# Patient Record
Sex: Female | Born: 1975
Health system: Southern US, Community
[De-identification: ages and names within clinical notes are randomized; demographics above are authoritative.]

## PROBLEM LIST (undated history)

## (undated) DIAGNOSIS — T7840XA Allergy, unspecified, initial encounter: Secondary | ICD-10-CM

## (undated) DIAGNOSIS — F419 Anxiety disorder, unspecified: Secondary | ICD-10-CM

## (undated) DIAGNOSIS — B019 Varicella without complication: Secondary | ICD-10-CM

## (undated) HISTORY — DX: Anxiety disorder, unspecified: F41.9

## (undated) HISTORY — DX: Allergy, unspecified, initial encounter: T78.40XA

## (undated) HISTORY — DX: Varicella without complication: B01.9

---

## 2016-03-05 HISTORY — PX: APPENDECTOMY: SHX54

## 2017-01-09 ENCOUNTER — Other Ambulatory Visit: Payer: Self-pay | Admitting: Physician Assistant

## 2017-01-09 DIAGNOSIS — Z139 Encounter for screening, unspecified: Secondary | ICD-10-CM

## 2017-02-07 ENCOUNTER — Ambulatory Visit: Payer: Self-pay

## 2018-04-24 DIAGNOSIS — E559 Vitamin D deficiency, unspecified: Secondary | ICD-10-CM | POA: Diagnosis not present

## 2018-04-24 DIAGNOSIS — R5383 Other fatigue: Secondary | ICD-10-CM | POA: Diagnosis not present

## 2018-07-03 DIAGNOSIS — Z01419 Encounter for gynecological examination (general) (routine) without abnormal findings: Secondary | ICD-10-CM | POA: Diagnosis not present

## 2018-07-03 DIAGNOSIS — Z6830 Body mass index (BMI) 30.0-30.9, adult: Secondary | ICD-10-CM | POA: Diagnosis not present

## 2019-07-16 DIAGNOSIS — Z9889 Other specified postprocedural states: Secondary | ICD-10-CM | POA: Insufficient documentation

## 2019-07-16 HISTORY — DX: Other specified postprocedural states: Z98.890

## 2019-07-20 ENCOUNTER — Other Ambulatory Visit: Payer: Self-pay | Admitting: Obstetrics and Gynecology

## 2019-07-20 DIAGNOSIS — R928 Other abnormal and inconclusive findings on diagnostic imaging of breast: Secondary | ICD-10-CM

## 2019-07-22 ENCOUNTER — Ambulatory Visit
Admission: RE | Admit: 2019-07-22 | Discharge: 2019-07-22 | Disposition: A | Discharge status: Home / Self Care | Payer: 59 | Source: Ambulatory Visit | Attending: Obstetrics and Gynecology | Admitting: Obstetrics and Gynecology

## 2019-07-22 ENCOUNTER — Other Ambulatory Visit: Payer: Self-pay

## 2019-07-22 DIAGNOSIS — R928 Other abnormal and inconclusive findings on diagnostic imaging of breast: Secondary | ICD-10-CM

## 2019-11-27 ENCOUNTER — Ambulatory Visit: Payer: 59 | Admitting: Family Medicine

## 2019-12-16 ENCOUNTER — Ambulatory Visit: Payer: 59 | Admitting: Family Medicine

## 2019-12-31 NOTE — Progress Notes (Signed)
Pt doesn't have appt sched

## 2021-06-04 IMAGING — MG DIGITAL DIAGNOSTIC UNILAT LEFT W/ CAD
4 series · 4 of 8 positions shown · non-contrast
Comparison: Previous exam(s).

CLINICAL DATA: The patient was called back for left breast
calcifications.

EXAM:
DIGITAL DIAGNOSTIC LEFT MAMMOGRAM

[L ML]
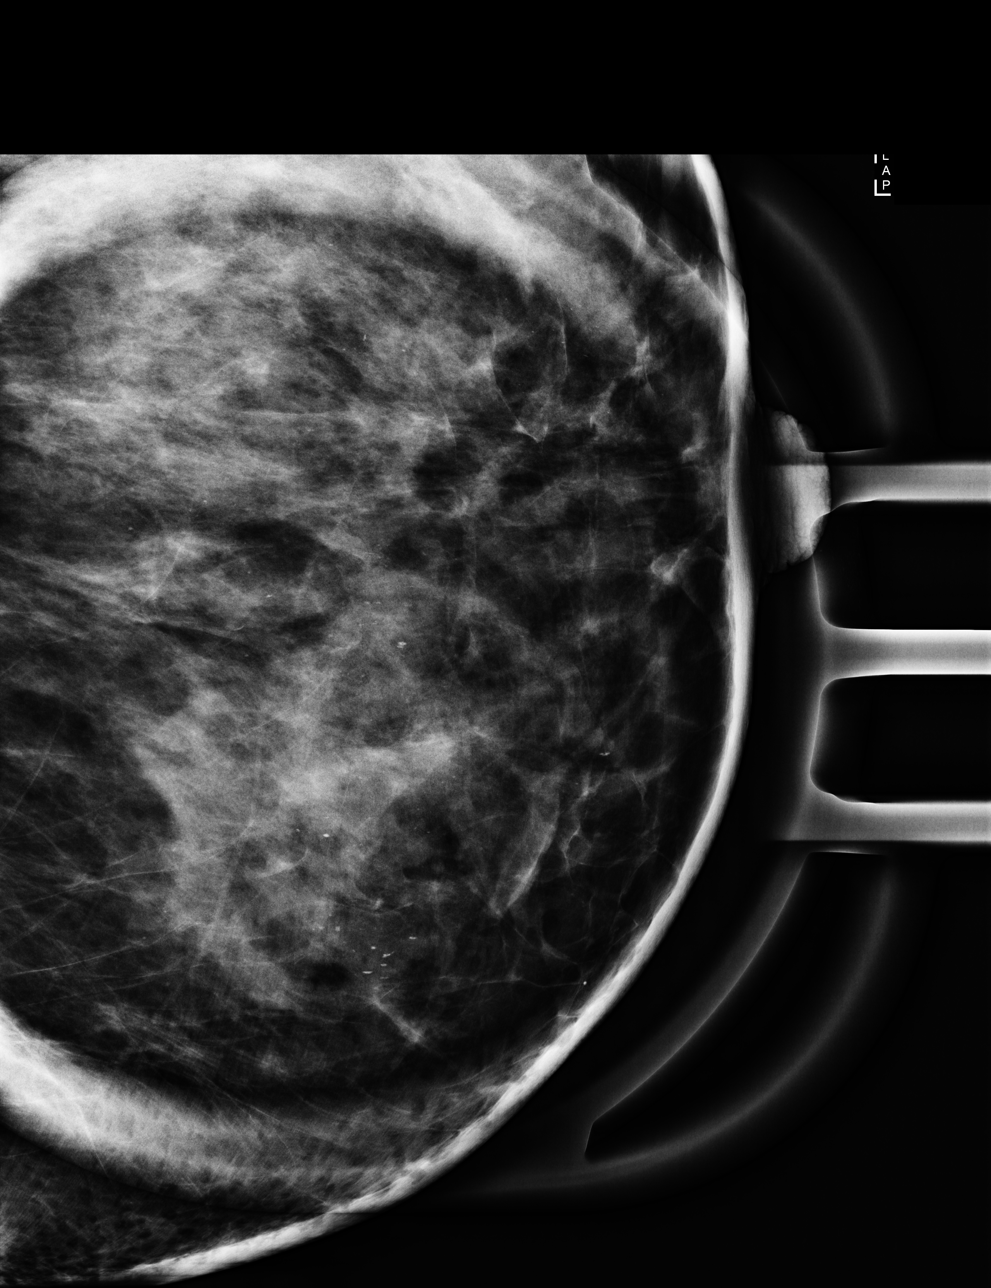

[L CC]
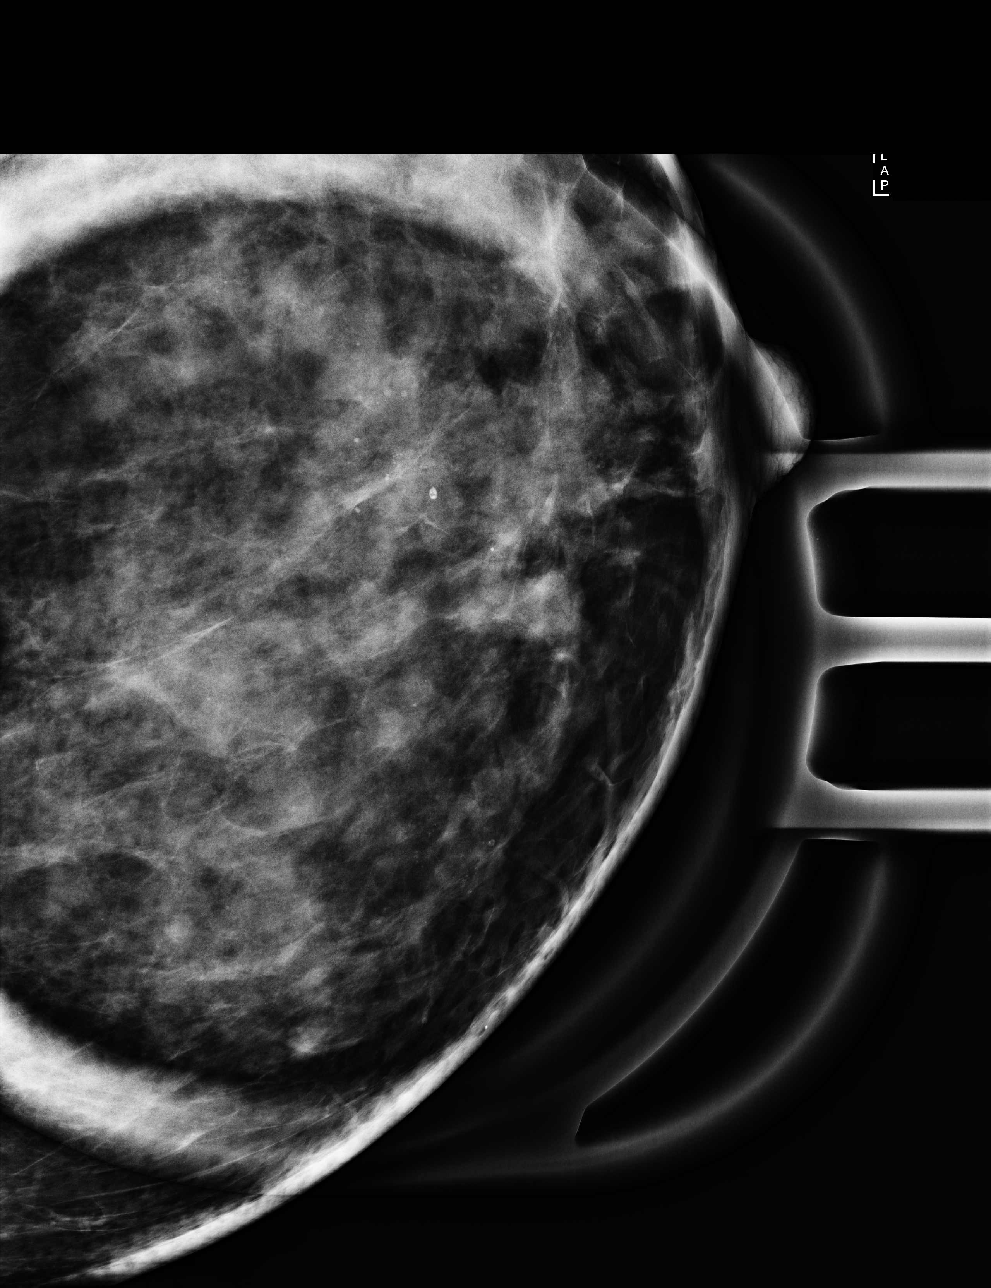

[L ML synth-2D]
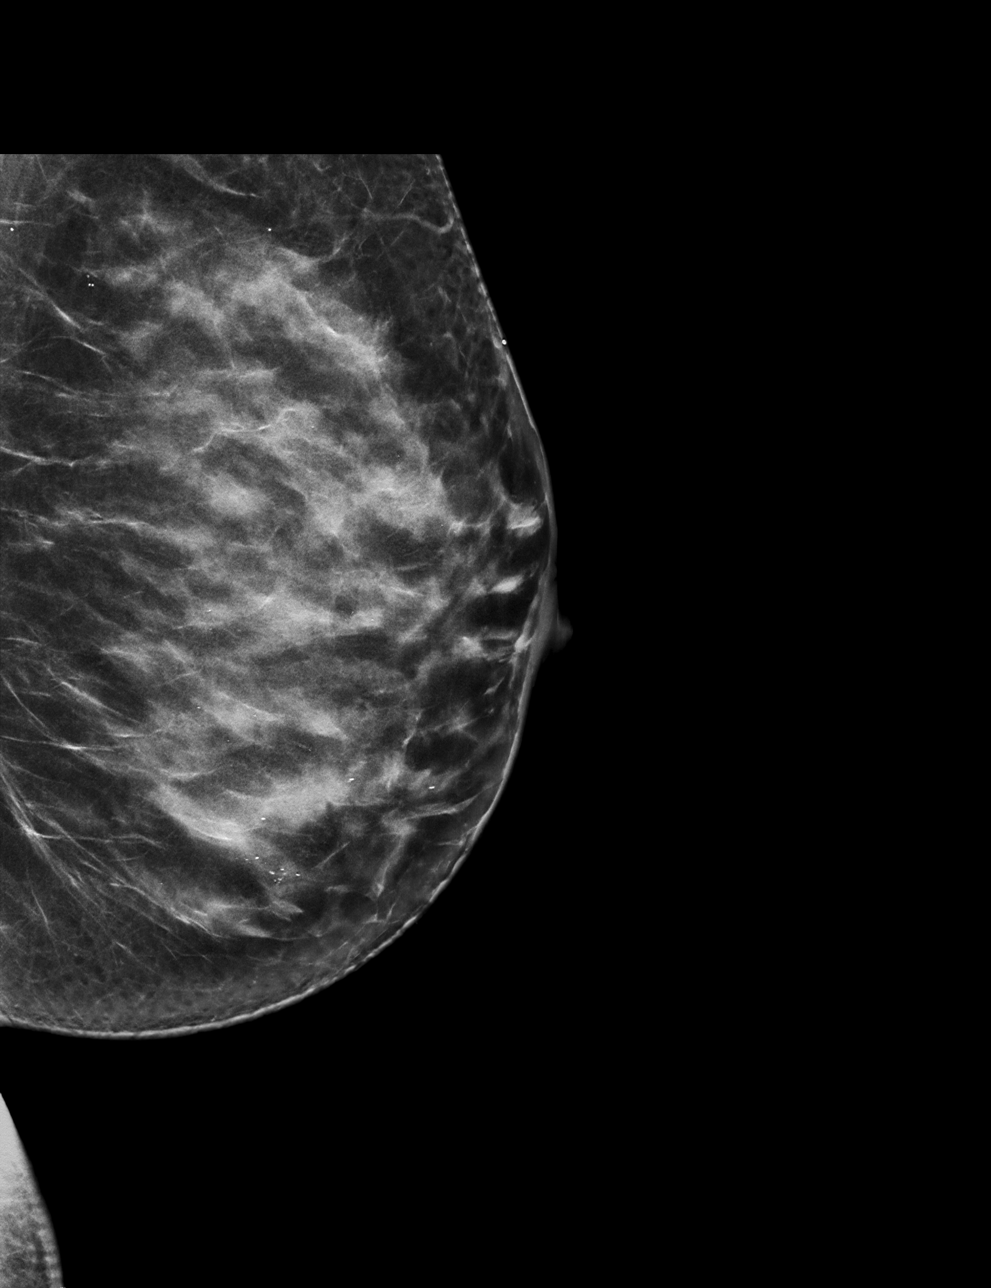

[L ML tomo · tomo slice 37/72.0]
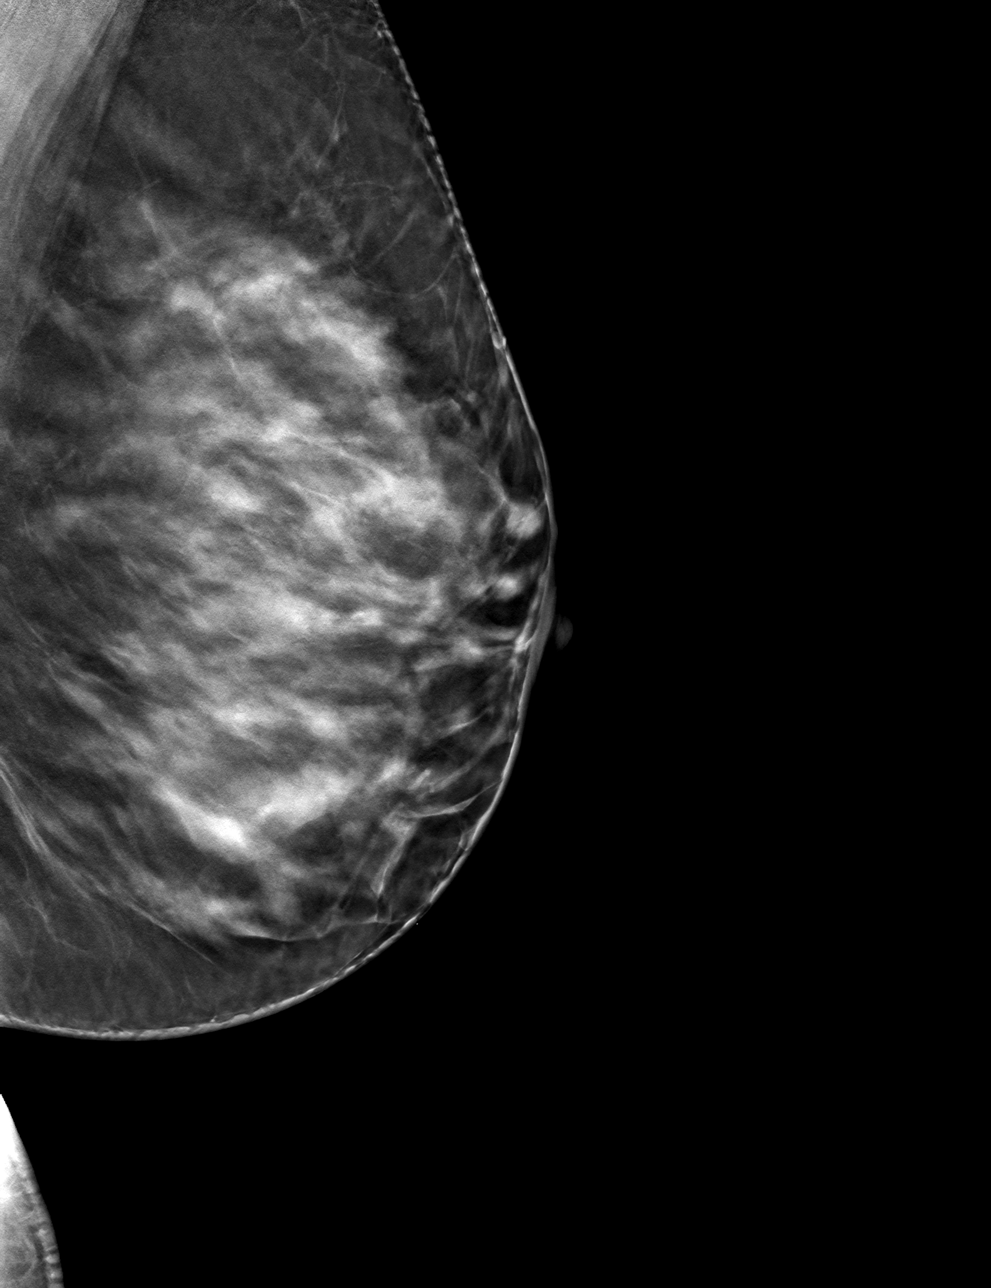

[4 of 8 positions shown; findings below may reference images not displayed]

ACR Breast Density Category c: The breast tissue is heterogeneously
dense, which may obscure small masses.
FINDINGS: The calcifications in the medial left breast layer on 90 degree
imaging. No evidence of malignancy.
IMPRESSION: Milk of calcium in the left breast. No suspicious findings. No
evidence of malignancy.

RECOMMENDATION:
Annual screening mammography.

I have discussed the findings and recommendations with the patient.
If applicable, a reminder letter will be sent to the patient
regarding the next appointment.

BI-RADS CATEGORY  2: Benign.

## 2021-09-21 LAB — HM PAP SMEAR: Pap: NEGATIVE

## 2021-09-21 LAB — HM MAMMOGRAPHY

## 2021-09-22 LAB — HM PAP SMEAR: Pap: NEGATIVE

## 2022-02-09 ENCOUNTER — Encounter: Payer: Self-pay | Admitting: Family Medicine

## 2022-02-12 ENCOUNTER — Encounter: Payer: Self-pay | Admitting: Family Medicine

## 2022-02-12 ENCOUNTER — Ambulatory Visit (INDEPENDENT_AMBULATORY_CARE_PROVIDER_SITE_OTHER): Payer: 59 | Admitting: Family Medicine

## 2022-02-12 VITALS — BP 124/84 | HR 84 | Temp 97.7°F | Ht 63.5 in | Wt 180.0 lb

## 2022-02-12 DIAGNOSIS — Z23 Encounter for immunization: Secondary | ICD-10-CM | POA: Diagnosis not present

## 2022-02-12 DIAGNOSIS — F909 Attention-deficit hyperactivity disorder, unspecified type: Secondary | ICD-10-CM | POA: Diagnosis not present

## 2022-02-12 DIAGNOSIS — Z7689 Persons encountering health services in other specified circumstances: Secondary | ICD-10-CM | POA: Diagnosis not present

## 2022-02-12 MED ORDER — LISDEXAMFETAMINE DIMESYLATE 30 MG PO CAPS: 30.0000 mg | ORAL_CAPSULE | Freq: Every day | ORAL | 0 refills | Status: DC

## 2022-02-12 NOTE — Progress Notes (Signed)
Patient ID: Jamie Lin, female  DOB: 1975-08-11, 45 y.o.   MRN: [id number] Patient Care Team    Relationship Specialty Notifications Start End  Natalia Leatherwood, DO PCP - General Family Medicine  02/12/22   [facility]  Optometry  02/12/22   Harold Hedge, MD Consulting Physician Obstetrics and Gynecology  02/12/22     Chief Complaint  Patient presents with   Establish Care   ADHD    Subjective:  Jamie Lin is a 46 y.o.  female present for new patient establishment. All past medical history, surgical history, allergies, family history, immunizations, medications and social history were updated in the electronic medical record today. All recent labs, ED visits and hospitalizations within the last year were reviewed.  Adhd: Pt reports she has been prescribed vyvanse 30 mg qd and is compliant. She reports this was started for her in September.  She was being evaluated for her anxiety and they wanted to rule out ADHD as a potential cause of increased anxiety.  She did see a psychiatrist who diagnosed her with ADHD with mild anxiety.  They started her on Vyvanse and tapered her to 30 mg daily.  She states she does take this Monday through Friday or at least on workdays, and she feels it is helpful.  She feels her anxiety has decreased currently. Patient reports she had 1 anxiety and panic during COVID.  Reports in the past she was prescribed Zoloft for about 6 to 8 months and she noticed it blunted her emotions.  She also gained weight on the Zoloft which she is still having trouble getting off.  She was prescribed Wellbutrin but she admits she never took the Wellbutrin.     02/12/2022    1:03 PM  Depression screen PHQ 2/9  Decreased Interest 0  Down, Depressed, Hopeless 0  PHQ - 2 Score 0       No data to display                 No data to display         Immunization History  Administered Date(s) Administered   Hepatitis A, Adult 08/06/2017    Influenza,inj,Quad PF,6+ Mos 01/09/2017, 01/22/2019, 02/12/2022   Influenza-Unspecified 01/09/2017, 01/22/2019, 12/16/2019   Moderna Sars-Covid-2 Vaccination 10/05/2019, 11/02/2019    No results found.  Past Medical History:  Diagnosis Date   Allergies    Anxiety    Chicken pox    History of endometrial ablation 07/16/2019   No Known Allergies Past Surgical History:  Procedure Laterality Date   APPENDECTOMY  2018   CESAREAN SECTION  2010   x2-2010,2012   Family History  Problem Relation Age of Onset   Hyperlipidemia Mother    Diabetes Father    Hypertension Father    Hyperlipidemia Father    Learning disabilities Son    Diabetes Maternal Grandmother    Diabetes Maternal Grandfather    Hypertension Maternal Grandfather    Hyperlipidemia Maternal Grandfather    Heart attack Maternal Grandfather    Early death Paternal Grandmother    Heart attack Paternal Grandmother    Hypertension Paternal Grandfather    Hyperlipidemia Paternal Grandfather    Social History   Social History Narrative   Marital status/children/pets: Married, G2 P2   Education/employment: Bachelor's degree.  Employed as an Field seismologist:      -smoke alarm in the home:Yes     - wears seatbelt: Yes     -  Feels safe in their relationships: Yes       Allergies as of 02/12/2022   No Known Allergies      Medication List        Accurate as of February 12, 2022  4:21 PM. If you have any questions, ask your nurse or doctor.          Cholecalciferol 1.25 MG (50000 UT) Tabs Take by mouth.   Vyvanse 30 MG capsule Generic drug: lisdexamfetamine Take 1 capsule by mouth daily. What changed: Another medication with the same name was added. Make sure you understand how and when to take each. Changed by: Felix Pacini, DO   lisdexamfetamine 30 MG capsule Commonly known as: Vyvanse Take 1 capsule (30 mg total) by mouth daily. What changed: You were already taking a  medication with the same name, and this prescription was added. Make sure you understand how and when to take each. Changed by: Felix Pacini, DO   lisdexamfetamine 30 MG capsule Commonly known as: VYVANSE Take 1 capsule (30 mg total) by mouth daily. What changed: You were already taking a medication with the same name, and this prescription was added. Make sure you understand how and when to take each. Changed by: Felix Pacini, DO   lisdexamfetamine 30 MG capsule Commonly known as: VYVANSE Take 1 capsule (30 mg total) by mouth daily. What changed: You were already taking a medication with the same name, and this prescription was added. Make sure you understand how and when to take each. Changed by: Felix Pacini, DO        All past medical history, surgical history, allergies, family history, immunizations andmedications were updated in the EMR today and reviewed under the history and medication portions of their EMR.    No results found for this or any previous visit (from the past 2160 hour(s)).   ROS 14 pt review of systems performed and negative (unless mentioned in an HPI)  Objective: BP 124/84   Pulse 84   Temp 97.7 F (36.5 C) (Oral)   Ht 5' 3.5" (1.613 m)   Wt 180 lb (81.6 kg)   LMP 02/08/2022   SpO2 100%   BMI 31.39 kg/m  Physical Exam Vitals and nursing note reviewed.  Constitutional:      General: She is not in acute distress.    Appearance: Normal appearance. She is not ill-appearing, toxic-appearing or diaphoretic.  HENT:     Head: Normocephalic and atraumatic.  Eyes:     General: No scleral icterus.       Right eye: No discharge.        Left eye: No discharge.     Extraocular Movements: Extraocular movements intact.     Conjunctiva/sclera: Conjunctivae normal.     Pupils: Pupils are equal, round, and reactive to light.  Cardiovascular:     Rate and Rhythm: Normal rate and regular rhythm.  Pulmonary:     Effort: Pulmonary effort is normal.     Breath  sounds: Normal breath sounds.  Skin:    General: Skin is warm and dry.     Findings: No rash.  Neurological:     Mental Status: She is alert and oriented to person, place, and time. Mental status is at baseline.     Motor: No weakness.     Gait: Gait normal.  Psychiatric:        Mood and Affect: Mood normal.        Behavior: Behavior normal.  Thought Content: Thought content normal.        Judgment: Judgment normal.      Assessment/plan: Jamie Lin is a 46 y.o. female present for est care/cmc Establishing care with new doctor, encounter for attention deficit hyperactivity disorder (ADHD), unspecified ADHD type Stable.  Continue vyvanse 30 mg qd Discussed different options today for treatment and could consider Wellbutrin or Effexor in the future. NCCS database reviewed today and appropriate   Influenza vaccine needed administered today  Return in about 11 weeks (around 04/30/2022) for cpe (20 min), Routine chronic condition follow-up.  Orders Placed This Encounter  Procedures   Flu Vaccine QUAD 6+ mos PF IM (Fluarix Quad PF)   Meds ordered this encounter  Medications   lisdexamfetamine (VYVANSE) 30 MG capsule    Sig: Take 1 capsule (30 mg total) by mouth daily.    Dispense:  30 capsule    Refill:  0    May fill ~55 days after prescribed date   lisdexamfetamine (VYVANSE) 30 MG capsule    Sig: Take 1 capsule (30 mg total) by mouth daily.    Dispense:  30 capsule    Refill:  0    May fill ~25 days after prescribed date   lisdexamfetamine (VYVANSE) 30 MG capsule    Sig: Take 1 capsule (30 mg total) by mouth daily.    Dispense:  30 capsule    Refill:  0   Referral Orders  No referral(s) requested today     Note is dictated utilizing voice recognition software. Although note has been proof read prior to signing, occasional typographical errors still can be missed. If any questions arise, please do not hesitate to call for verification.  Electronically signed  by: Felix Pacini, DO [facility]

## 2022-02-12 NOTE — Patient Instructions (Addendum)
Return in about 11 weeks (around 04/30/2022) for cpe (20 min), Routine chronic condition follow-up.        Great to see you today.  I have refilled the medication(s) we provide.   If labs were collected, we will inform you of lab results once received either by echart message or telephone call.   - echart message- for normal results that have been seen by the patient already.   - telephone call: abnormal results or if patient has not viewed results in their echart.

## 2022-04-04 ENCOUNTER — Telehealth: Payer: Self-pay | Admitting: Family Medicine

## 2022-04-04 NOTE — Telephone Encounter (Signed)
Pt needs refill on lisdexamfetamine (VYVANSE) 30 MG capsule  she would like it sent to the [facility] on [address] in [place]

## 2022-04-04 NOTE — Telephone Encounter (Signed)
Patient was seen 02/12/2022 for her ADD and 3 prescriptions for Vyvanse were sent to her pharmacy.  She had been instructed on how to receive her prescriptions. She will need to call the pharmacy every time she needs a refill on Vyvanse.  She cannot call in the prescription number for refill, because it has to change every month. She follows up every 3 months with provider to receive 3 new prescriptions.  As long as she is scheduled every 3 months with provider, she will not run out of prescriptions at the pharmacy.

## 2022-04-05 ENCOUNTER — Encounter: Payer: Self-pay | Admitting: Family Medicine

## 2022-04-05 ENCOUNTER — Ambulatory Visit (INDEPENDENT_AMBULATORY_CARE_PROVIDER_SITE_OTHER): Payer: BC Managed Care – PPO | Admitting: Family Medicine

## 2022-04-05 VITALS — BP 128/86 | HR 88 | Temp 97.8°F | Ht 63.5 in

## 2022-04-05 DIAGNOSIS — R051 Acute cough: Secondary | ICD-10-CM | POA: Diagnosis not present

## 2022-04-05 DIAGNOSIS — U071 COVID-19: Secondary | ICD-10-CM | POA: Diagnosis not present

## 2022-04-05 LAB — POCT INFLUENZA A/B
Influenza A, POC: NEGATIVE
Influenza B, POC: NEGATIVE

## 2022-04-05 LAB — POC COVID19 BINAXNOW: SARS Coronavirus 2 Ag: POSITIVE — AB

## 2022-04-05 MED ORDER — LISDEXAMFETAMINE DIMESYLATE 30 MG PO CAPS: 30.0000 mg | ORAL_CAPSULE | Freq: Every day | ORAL | 0 refills | Status: DC

## 2022-04-05 MED ORDER — FLUTICASONE PROPIONATE 50 MCG/ACT NA SUSP: 2.0000 | Freq: Every day | NASAL | 6 refills | Status: DC

## 2022-04-05 MED ORDER — PREDNISONE 20 MG PO TABS: 40.0000 mg | ORAL_TABLET | Freq: Every day | ORAL | 0 refills | Status: DC

## 2022-04-05 NOTE — Patient Instructions (Signed)
No follow-ups on file.        Great to see you today.  I have refilled the medication(s) we provide.   If labs were collected, we will inform you of lab results once received either by echart message or telephone call.   - echart message- for normal results that have been seen by the patient already.   - telephone call: abnormal results or if patient has not viewed results in their echart.  

## 2022-04-05 NOTE — Progress Notes (Signed)
Jamie Lin , 1975/08/07, 47 y.o., female MRN: [id number] Patient Care Team    Relationship Specialty Notifications Start End  Ma Hillock, DO PCP - General Family Medicine  02/12/22   [facility] center  Optometry  02/12/22   Everlene Farrier, MD Consulting Physician Obstetrics and Gynecology  02/12/22     Chief Complaint  Patient presents with   Ear Fullness    Pt c/o ear fullness, loss of taste and smell, cough, post nasal drip x 1 week     Subjective: Pt presents for an OV with complaints of ear fullness, loss of taste and smell, dry cough and postnasal drip of 1 week duration.  She has not come in contact with any known sick contacts.  She reports she felt like she had a head cold and a start of a sinus infection.  Her husband noted today he started feeling sick.     02/12/2022    1:03 PM  Depression screen PHQ 2/9  Decreased Interest 0  Down, Depressed, Hopeless 0  PHQ - 2 Score 0    No Known Allergies Social History   Social History Narrative   Marital status/children/pets: Married, G2 P2   Education/employment: Bachelor's degree.  Employed as an Academic librarian:      -smoke alarm in the home:Yes     - wears seatbelt: Yes     - Feels safe in their relationships: Yes      Past Medical History:  Diagnosis Date   Allergies    Anxiety    Chicken pox    History of endometrial ablation 07/16/2019   Past Surgical History:  Procedure Laterality Date   APPENDECTOMY  2018   CESAREAN SECTION  2010   x2-2010,2012   Family History  Problem Relation Age of Onset   Hyperlipidemia Mother    Diabetes Father    Hypertension Father    Hyperlipidemia Father    Learning disabilities Son    Diabetes Maternal Grandmother    Diabetes Maternal Grandfather    Hypertension Maternal Grandfather    Hyperlipidemia Maternal Grandfather    Heart attack Maternal Grandfather    Early death Paternal Grandmother    Heart attack Paternal  Grandmother    Hypertension Paternal Grandfather    Hyperlipidemia Paternal Grandfather    Allergies as of 04/05/2022   No Known Allergies      Medication List        Accurate as of April 05, 2022 11:41 AM. If you have any questions, ask your nurse or doctor.          Cholecalciferol 1.25 MG (50000 UT) Tabs Take by mouth.   fluticasone 50 MCG/ACT nasal spray Commonly known as: FLONASE Place 2 sprays into both nostrils daily. Started by: Howard Pouch, DO   lisdexamfetamine 30 MG capsule Commonly known as: VYVANSE Take 1 capsule (30 mg total) by mouth daily. What changed: Another medication with the same name was removed. Continue taking this medication, and follow the directions you see here. Changed by: Howard Pouch, DO   lisdexamfetamine 30 MG capsule Commonly known as: VYVANSE Take 1 capsule (30 mg total) by mouth daily. What changed: Another medication with the same name was removed. Continue taking this medication, and follow the directions you see here. Changed by: Howard Pouch, DO   predniSONE 20 MG tablet Commonly known as: DELTASONE Take 2 tablets (40 mg total) by mouth daily with breakfast. Started by: Howard Pouch,  DO        All past medical history, surgical history, allergies, family history, immunizations andmedications were updated in the EMR today and reviewed under the history and medication portions of their EMR.     Review of Systems  Constitutional:  Negative for chills, fever and weight loss.  HENT:  Positive for congestion, ear pain and sinus pain. Negative for sore throat.   Eyes:  Negative for pain, discharge and redness.  Respiratory:  Negative for cough, hemoptysis, sputum production, shortness of breath and wheezing.   Gastrointestinal:  Negative for abdominal pain, nausea and vomiting.  Musculoskeletal:  Negative for myalgias.  Skin:  Negative for rash.  Neurological:  Positive for headaches. Negative for dizziness.   Negative,  with the exception of above mentioned in HPI   Objective:  BP 128/86   Pulse 88   Temp 97.8 F (36.6 C) (Oral)   Ht 5' 3.5" (1.613 m)   SpO2 99%   BMI 31.39 kg/m  Body mass index is 31.39 kg/m. Physical Exam Vitals and nursing note reviewed.  Constitutional:      General: She is not in acute distress.    Appearance: Normal appearance. She is normal weight. She is not ill-appearing or toxic-appearing.  HENT:     Head: Normocephalic and atraumatic.     Right Ear: Tympanic membrane, ear canal and external ear normal. There is no impacted cerumen.     Left Ear: Tympanic membrane, ear canal and external ear normal. There is no impacted cerumen.     Ears:     Comments: Left TM effusion    Nose: Rhinorrhea present. No congestion.     Mouth/Throat:     Mouth: Mucous membranes are moist.     Pharynx: No oropharyngeal exudate or posterior oropharyngeal erythema.  Eyes:     General: No scleral icterus.       Right eye: No discharge.        Left eye: No discharge.     Extraocular Movements: Extraocular movements intact.     Conjunctiva/sclera: Conjunctivae normal.     Pupils: Pupils are equal, round, and reactive to light.  Cardiovascular:     Rate and Rhythm: Normal rate and regular rhythm.  Pulmonary:     Effort: Pulmonary effort is normal. No respiratory distress.     Breath sounds: Normal breath sounds. No wheezing, rhonchi or rales.  Musculoskeletal:     Cervical back: Neck supple.  Lymphadenopathy:     Cervical: No cervical adenopathy.  Skin:    Findings: No rash.  Neurological:     Mental Status: She is alert and oriented to person, place, and time. Mental status is at baseline.     Motor: No weakness.     Coordination: Coordination normal.     Gait: Gait normal.  Psychiatric:        Mood and Affect: Mood normal.        Behavior: Behavior normal.        Thought Content: Thought content normal.        Judgment: Judgment normal.      No results found. No results  found. Results for orders placed or performed in visit on 04/05/22 (from the past 24 hour(s))  POC COVID-19 BinaxNow     Status: Abnormal   Collection Time: 04/05/22 11:33 AM  Result Value Ref Range   SARS Coronavirus 2 Ag Positive (A) Negative  POCT Influenza A/B     Status: Normal   Collection  Time: 04/05/22 11:33 AM  Result Value Ref Range   Influenza A, POC Negative Negative   Influenza B, POC Negative Negative    Assessment/Plan: Leighann Amadon is a 47 y.o. female present for OV for  1. Acute cough - POC COVID-19 BinaxNow-positive - POCT Influenza A/B-negative  2. COVID-19 Rest, hydrate.  + flonase, mucinex (DM if cough), nettie pot or nasal saline.  Resume burst prescribed, take until completed.  If cough present it can last up to 6-8 weeks.  F/U 2 weeks if not improved.  Reviewed home care instructions for COVID. Advised self-isolation at home for at least 5 days. After 5 days, if improved and fever resolved, can be in public, but should wear a mask around others for an additional 5 days. If symptoms, esp, dyspnea develops/worsens, recommend in-person evaluation at either an urgent care or the emergency room.  ADD-patient's pharmacy is unable to get Vyvanse in stock currently.  She is asking for transfer of [facility] to [facility]. Reviewed expectations re: course of current medical issues. Discussed self-management of symptoms. Outlined signs and symptoms indicating need for more acute intervention. Patient verbalized understanding and all questions were answered. Patient received an After-Visit Summary.    Orders Placed This Encounter  Procedures   POC COVID-19 BinaxNow   POCT Influenza A/B   Meds ordered this encounter  Medications   lisdexamfetamine (VYVANSE) 30 MG capsule    Sig: Take 1 capsule (30 mg total) by mouth daily.    Dispense:  30 capsule    Refill:  0    May fill ~25 days after prescribed date   lisdexamfetamine (VYVANSE) 30 MG capsule    Sig:  Take 1 capsule (30 mg total) by mouth daily.    Dispense:  30 capsule    Refill:  0   fluticasone (FLONASE) 50 MCG/ACT nasal spray    Sig: Place 2 sprays into both nostrils daily.    Dispense:  16 g    Refill:  6   predniSONE (DELTASONE) 20 MG tablet    Sig: Take 2 tablets (40 mg total) by mouth daily with breakfast.    Dispense:  10 tablet    Refill:  0   Referral Orders  No referral(s) requested today     Note is dictated utilizing voice recognition software. Although note has been proof read prior to signing, occasional typographical errors still can be missed. If any questions arise, please do not hesitate to call for verification.   electronically signed by:  Howard Pouch, DO  [place]

## 2022-04-05 NOTE — Telephone Encounter (Signed)
Pt discussed during OV that she wanted to transfer meds. Please see today OV

## 2022-04-30 ENCOUNTER — Encounter: Payer: Self-pay | Admitting: Family Medicine

## 2022-04-30 ENCOUNTER — Ambulatory Visit (INDEPENDENT_AMBULATORY_CARE_PROVIDER_SITE_OTHER): Payer: BC Managed Care – PPO | Admitting: Family Medicine

## 2022-04-30 VITALS — BP 115/82 | HR 89 | Temp 98.4°F | Ht 63.58 in | Wt 181.2 lb

## 2022-04-30 DIAGNOSIS — Z131 Encounter for screening for diabetes mellitus: Secondary | ICD-10-CM

## 2022-04-30 DIAGNOSIS — Z8249 Family history of ischemic heart disease and other diseases of the circulatory system: Secondary | ICD-10-CM

## 2022-04-30 DIAGNOSIS — Z13220 Encounter for screening for lipoid disorders: Secondary | ICD-10-CM | POA: Diagnosis not present

## 2022-04-30 DIAGNOSIS — Z130 Encounter for screening for diseases of the blood and blood-forming organs and certain disorders involving the immune mechanism: Secondary | ICD-10-CM

## 2022-04-30 DIAGNOSIS — Z1159 Encounter for screening for other viral diseases: Secondary | ICD-10-CM

## 2022-04-30 DIAGNOSIS — Z23 Encounter for immunization: Secondary | ICD-10-CM

## 2022-04-30 DIAGNOSIS — Z0000 Encounter for general adult medical examination without abnormal findings: Secondary | ICD-10-CM

## 2022-04-30 DIAGNOSIS — Z1231 Encounter for screening mammogram for malignant neoplasm of breast: Secondary | ICD-10-CM | POA: Diagnosis not present

## 2022-04-30 LAB — LIPID PANEL
Cholesterol: 212 mg/dL — ABNORMAL HIGH (ref 0–200)
HDL: 53.1 mg/dL (ref >39.00)
LDL Cholesterol: 137 mg/dL — ABNORMAL HIGH (ref 0–99)
NonHDL: 158.53
Total CHOL/HDL Ratio: 4
Triglycerides: 106 mg/dL (ref 0.0–149.0)
VLDL: 21.2 mg/dL (ref 0.0–40.0)

## 2022-04-30 LAB — COMPREHENSIVE METABOLIC PANEL
ALT: 17 U/L (ref 0–35)
AST: 13 U/L (ref 0–37)
Albumin: 4 g/dL (ref 3.5–5.2)
Alkaline Phosphatase: 59 U/L (ref 39–117)
BUN: 15 mg/dL (ref 6–23)
CO2: 26 mEq/L (ref 19–32)
Calcium: 9.4 mg/dL (ref 8.4–10.5)
Chloride: 105 mEq/L (ref 96–112)
Creatinine, Ser: 1.03 mg/dL (ref 0.40–1.20)
GFR: 66.22 mL/min (ref >60.00)
Glucose, Bld: 117 mg/dL — ABNORMAL HIGH (ref 70–99)
Potassium: 3.6 mEq/L (ref 3.5–5.1)
Sodium: 141 mEq/L (ref 135–145)
Total Bilirubin: 0.7 mg/dL (ref 0.2–1.2)
Total Protein: 6.4 g/dL (ref 6.0–8.3)

## 2022-04-30 LAB — CBC
HCT: 38.9 % (ref 36.0–46.0)
Hemoglobin: 13.4 g/dL (ref 12.0–15.0)
MCHC: 34.5 g/dL (ref 30.0–36.0)
MCV: 93 fl (ref 78.0–100.0)
Platelets: 262 10*3/uL (ref 150.0–400.0)
RBC: 4.18 Mil/uL (ref 3.87–5.11)
RDW: 12.4 % (ref 11.5–15.5)
WBC: 4.3 10*3/uL (ref 4.0–10.5)

## 2022-04-30 LAB — HEMOGLOBIN A1C: Hgb A1c MFr Bld: 5.3 % (ref 4.6–6.5)

## 2022-04-30 MED ORDER — LISDEXAMFETAMINE DIMESYLATE 30 MG PO CAPS: 30.0000 mg | ORAL_CAPSULE | Freq: Every day | ORAL | 0 refills | Status: DC

## 2022-04-30 NOTE — Patient Instructions (Addendum)
Return in about 11 weeks (around 07/16/2022) for Routine chronic condition follow-up.        Great to see you today.  I have refilled the medication(s) we provide.   If labs were collected, we will inform you of lab results once received either by echart message or telephone call.   - echart message- for normal results that have been seen by the patient already.   - telephone call: abnormal results or if patient has not viewed results in their echart.   If you are interested in weight loss counseling please make appt to discuss and bring with you 2 weeks of a food diary/log on notebook paper.  Food log is mandatory on first appt to proceed with counseling.  Weight loss counseling encompasses diet, exercise and can include  medications when appropriate and affordable.  There are routine appts for check-ins and weights to track progress and keep you on track.  Routine check-ins (in person) are also mandatory to continue with prescription refills. Check-in timeline  can range from 4 weeks to 12 weeks, depending on physician's recommendations and which step you are in of your weight loss journey.  Your BMI today is Body mass index is 31.51 kg/m.  Please check with your insurance prior to appt and ask them if they cover weight loss medications for your BMI? And if so, which medications. They may tell you some of the diabetes meds that are used for weight loss also,  are on your formulary- but this does not mean they are covered for weight loss only.  Even if they tell with a prior auth it is covered- make sure they check to see if you personally meet criteria with your BMI.

## 2022-04-30 NOTE — Progress Notes (Signed)
Patient ID: Jamie Lin, female  DOB: 1975-03-18, 47 y.o.   MRN: [id number] Patient Care Team    Relationship Specialty Notifications Start End  Ma Hillock, DO PCP - General Family Medicine  02/12/22   [facility]  Optometry  02/12/22   Everlene Farrier, MD Consulting Physician Obstetrics and Gynecology  02/12/22     Chief Complaint  Patient presents with   Annual Exam    [facility] Pt is not fasting    Subjective: Jamie Lin is a 47 y.o.  Female  present for CPE  All past medical history, surgical history, allergies, family history, immunizations, medications and social history were updated in the electronic medical record today. All recent labs, ED visits and hospitalizations within the last year were reviewed.  Health maintenance:  Colonoscopy: No family history.  Routine screening at 47 Mammogram: Completed 11/03/2021.  Completed at oncology Cervical cancer screening: last pap: 11/2021, by gynecology Immunizations: tdap updated today, Influenza UTD 2023 (encouraged yearly) Infectious disease screening: HIV completed, Hep C agreeable DEXA: Routine screening Patient has a Dental home. Hospitalizations/ED visits: Reviewed       04/30/2022    8:33 AM 02/12/2022    1:03 PM  Depression screen PHQ 2/9  Decreased Interest 0 0  Down, Depressed, Hopeless 0 0  PHQ - 2 Score 0 0       No data to display          Immunization History  Administered Date(s) Administered   Hepatitis A, Adult 08/06/2017   Influenza,inj,Quad PF,6+ Mos 01/09/2017, 01/22/2019, 02/12/2022   Influenza-Unspecified 01/09/2017, 01/22/2019, 12/16/2019   Moderna Sars-Covid-2 Vaccination 10/05/2019, 11/02/2019   Tdap 04/30/2022     Past Medical History:  Diagnosis Date   Allergies    Anxiety    Chicken pox    History of endometrial ablation 07/16/2019   No Known Allergies Past Surgical History:  Procedure Laterality Date   APPENDECTOMY  2018   CESAREAN SECTION  2010   x2-2010,2012    Family History  Problem Relation Age of Onset   Hyperlipidemia Mother    Diabetes Father    Hypertension Father    Hyperlipidemia Father    Learning disabilities Son    Diabetes Maternal Grandmother    Diabetes Maternal Grandfather    Hypertension Maternal Grandfather    Hyperlipidemia Maternal Grandfather    Heart attack Maternal Grandfather    Early death Paternal Grandmother    Heart attack Paternal Grandmother    Hypertension Paternal Grandfather    Hyperlipidemia Paternal Grandfather    Social History   Social History Narrative   Marital status/children/pets: Married, G2 P2   Education/employment: Bachelor's degree.  Employed as an Academic librarian:      -smoke alarm in the home:Yes     - wears seatbelt: Yes     - Feels safe in their relationships: Yes       Allergies as of 04/30/2022   No Known Allergies      Medication List        Accurate as of April 30, 2022  9:09 AM. If you have any questions, ask your nurse or doctor.          STOP taking these medications    predniSONE 20 MG tablet Commonly known as: DELTASONE Stopped by: Howard Pouch, DO       TAKE these medications    Cholecalciferol 1.25 MG (50000 UT) Tabs Take by mouth.   fluticasone 50 MCG/ACT  nasal spray Commonly known as: FLONASE Place 2 sprays into both nostrils daily.   lisdexamfetamine 30 MG capsule Commonly known as: VYVANSE Take 1 capsule (30 mg total) by mouth daily.   lisdexamfetamine 30 MG capsule Commonly known as: VYVANSE Take 1 capsule (30 mg total) by mouth daily.   MAG GLYCINATE PO Take by mouth.   PROBIOTIC BLEND PO Take by mouth.        All past medical history, surgical history, allergies, family history, immunizations andmedications were updated in the EMR today and reviewed under the history and medication portions of their EMR.     Recent Results (from the past 2160 hour(s))  POC COVID-19 BinaxNow     Status: Abnormal    Collection Time: 04/05/22 11:33 AM  Result Value Ref Range   SARS Coronavirus 2 Ag Positive (A) Negative  POCT Influenza A/B     Status: Normal   Collection Time: 04/05/22 11:33 AM  Result Value Ref Range   Influenza A, POC Negative Negative   Influenza B, POC Negative Negative     ROS 14 pt review of systems performed and negative (unless mentioned in an HPI)  Objective: BP 115/82   Pulse 89   Temp 98.4 F (36.9 C)   Ht 5' 3.58" (1.615 m)   Wt 181 lb 3.2 oz (82.2 kg)   LMP 04/20/2022   SpO2 99%   BMI 31.51 kg/m  Physical Exam Vitals and nursing note reviewed.  Constitutional:      General: She is not in acute distress.    Appearance: Normal appearance. She is not ill-appearing or toxic-appearing.  HENT:     Head: Normocephalic and atraumatic.     Right Ear: Tympanic membrane, ear canal and external ear normal. There is no impacted cerumen.     Left Ear: Tympanic membrane, ear canal and external ear normal. There is no impacted cerumen.     Nose: No congestion or rhinorrhea.     Mouth/Throat:     Mouth: Mucous membranes are moist.     Pharynx: Oropharynx is clear. No oropharyngeal exudate or posterior oropharyngeal erythema.  Eyes:     General: No scleral icterus.       Right eye: No discharge.        Left eye: No discharge.     Extraocular Movements: Extraocular movements intact.     Conjunctiva/sclera: Conjunctivae normal.     Pupils: Pupils are equal, round, and reactive to light.  Cardiovascular:     Rate and Rhythm: Normal rate and regular rhythm.     Pulses: Normal pulses.     Heart sounds: Normal heart sounds. No murmur heard.    No friction rub. No gallop.  Pulmonary:     Effort: Pulmonary effort is normal. No respiratory distress.     Breath sounds: Normal breath sounds. No stridor. No wheezing, rhonchi or rales.  Chest:     Chest wall: No tenderness.  Abdominal:     General: Abdomen is flat. Bowel sounds are normal. There is no distension.      Palpations: Abdomen is soft. There is no mass.     Tenderness: There is no abdominal tenderness. There is no right CVA tenderness, left CVA tenderness, guarding or rebound.     Hernia: No hernia is present.  Musculoskeletal:        General: No swelling, tenderness or deformity. Normal range of motion.     Cervical back: Normal range of motion and neck supple. No rigidity  or tenderness.     Right lower leg: No edema.     Left lower leg: No edema.  Lymphadenopathy:     Cervical: No cervical adenopathy.  Skin:    General: Skin is warm and dry.     Coloration: Skin is not jaundiced or pale.     Findings: No bruising, erythema, lesion or rash.  Neurological:     General: No focal deficit present.     Mental Status: She is alert and oriented to person, place, and time. Mental status is at baseline.     Cranial Nerves: No cranial nerve deficit.     Sensory: No sensory deficit.     Motor: No weakness.     Coordination: Coordination normal.     Gait: Gait normal.     Deep Tendon Reflexes: Reflexes normal.  Psychiatric:        Mood and Affect: Mood normal.        Behavior: Behavior normal.        Thought Content: Thought content normal.        Judgment: Judgment normal.      No results found.  Assessment/plan: Domingue Delorge is a 47 y.o. female present for CPE  Encounter for hepatitis C screening test for low risk patient - Hepatitis C Antibody Need for Tdap vaccination Updated today Lipid screening/ FH: heart disease - Lipid panel Diabetes mellitus screening - Comp Met (CMET) - Hemoglobin A1c Screening for deficiency anemia - CBC Routine general medical examination at a health care facility - CBC - Comp Met (CMET) - Hepatitis C Antibody - Lipid panel - Hemoglobin A1c Colonoscopy: No family history.  Routine screening at 47 Mammogram: Completed 11/03/2021.  Completed at oncology Cervical cancer screening: last pap: 11/2021, by gynecology Immunizations: tdap updated today,  Influenza UTD 2023 (encouraged yearly) Infectious disease screening: HIV completed, Hep C agreeable DEXA: Routine screening Patient was encouraged to exercise greater than 150 minutes a week. Patient was encouraged to choose a diet filled with fresh fruits and vegetables, and lean meats. AVS provided to patient today for education/recommendation on gender specific health and safety maintenance.  If you are interested in weight loss counseling please make appt to discuss and bring with you 2 weeks of a food diary/log on notebook paper.  Food log is mandatory on first appt to proceed with counseling.  Weight loss counseling encompasses diet, exercise and can include  medications when appropriate and affordable.  There are routine appts for check-ins and weights to track progress and keep you on track.  Routine check-ins (in person) are also mandatory to continue with prescription refills. Check-in timeline  can range from 4 weeks to 12 weeks, depending on physician's recommendations and which step you are in of your weight loss journey.  Your BMI today is Body mass index is 31.51 kg/m.  Please check with your insurance prior to appt and ask them if they cover weight loss medications for your BMI? And if so, which medications. They may tell you some of the diabetes meds that are used for weight loss also,  are on your formulary- but this does not mean they are covered for weight loss only.  Even if they tell with a prior auth it is covered- make sure they check to see if you personally meet criteria with your BMI.   Return in about 11 weeks (around 07/16/2022) for Routine chronic condition follow-up.   Orders Placed This Encounter  Procedures   Tdap vaccine greater than  or equal to 7yo IM   CBC   Comp Met (CMET)   Hepatitis C Antibody   Lipid panel   Hemoglobin A1c   Meds ordered this encounter  Medications   lisdexamfetamine (VYVANSE) 30 MG capsule    Sig: Take 1 capsule (30 mg total)  by mouth daily.    Dispense:  30 capsule    Refill:  0   lisdexamfetamine (VYVANSE) 30 MG capsule    Sig: Take 1 capsule (30 mg total) by mouth daily.    Dispense:  30 capsule    Refill:  0    May fill ~25 days after prescribed date   Referral Orders  No referral(s) requested today     Electronically signed by: Howard Pouch, [place]

## 2022-05-01 LAB — HEPATITIS C ANTIBODY: Hepatitis C Ab: NONREACTIVE

## 2022-07-16 ENCOUNTER — Ambulatory Visit (INDEPENDENT_AMBULATORY_CARE_PROVIDER_SITE_OTHER): Payer: BC Managed Care – PPO | Admitting: Family Medicine

## 2022-07-16 ENCOUNTER — Encounter: Payer: Self-pay | Admitting: Family Medicine

## 2022-07-16 VITALS — BP 120/80 | HR 104 | Temp 98.6°F | Wt 184.6 lb

## 2022-07-16 DIAGNOSIS — F902 Attention-deficit hyperactivity disorder, combined type: Secondary | ICD-10-CM | POA: Diagnosis not present

## 2022-07-16 MED ORDER — METHYLPHENIDATE HCL ER (OSM) 36 MG PO TBCR: 36.0000 mg | EXTENDED_RELEASE_TABLET | Freq: Every day | ORAL | 0 refills | Status: DC

## 2022-07-16 NOTE — Patient Instructions (Signed)
No follow-ups on file.        Great to see you today.  I have refilled the medication(s) we provide.   If labs were collected, we will inform you of lab results once received either by echart message or telephone call.   - echart message- for normal results that have been seen by the patient already.   - telephone call: abnormal results or if patient has not viewed results in their echart.  

## 2022-07-16 NOTE — Progress Notes (Signed)
Patient ID: Jamie Lin, female  DOB: 11-12-1975, 47 y.o.   MRN: [id number] Patient Care Team    Relationship Specialty Notifications Start End  Natalia Leatherwood, DO PCP - General Family Medicine  02/12/22   [facility] center  Optometry  02/12/22   Harold Hedge, MD Consulting Physician Obstetrics and Gynecology  02/12/22     Chief Complaint  Patient presents with   ADHD    Pt is having complications getting Rx filled     Subjective:  Jamie Lin is a 47 y.o.  female present for follow-up on ADHD All past medical history, surgical history, allergies, family history, immunizations, medications and social history were updated in the electronic medical record today. All recent labs, ED visits and hospitalizations within the last year were reviewed.  Adhd: Pt reports compliance with vyvanse 30 mg qd.  She feels this is working well for her. However, she has been unable to find the medication in stock. She is inquiring if there is a similar medication she could start instead. Prior note: She reports this was started for her in September.  She was being evaluated for her anxiety and they wanted to rule out ADHD as a potential cause of increased anxiety.  She did see a psychiatrist who diagnosed her with ADHD with mild anxiety.  They started her on Vyvanse and tapered her to 30 mg daily.  She states she does take this Monday through Friday or at least on workdays, and she feels it is helpful.  Patient reports she had 1 anxiety and panic during COVID.  Reports in the past she was prescribed Zoloft for about 6 to 8 months and she noticed it blunted her emotions.  She also gained weight on the Zoloft which she is still having trouble getting off.  She was prescribed Wellbutrin but she admits she never took the Wellbutrin.     07/16/2022    8:49 AM 04/30/2022    8:33 AM 02/12/2022    1:03 PM  Depression screen PHQ 2/9  Decreased Interest 0 0 0  Down, Depressed, Hopeless 0 0 0  PHQ - 2  Score 0 0 0       No data to display                07/16/2022    8:48 AM  Fall Risk   Falls in the past year? 0  Number falls in past yr: 0  Injury with Fall? 0  Risk for fall due to : No Fall Risks  Follow up Falls evaluation completed   Immunization History  Administered Date(s) Administered   Hepatitis A, Adult 08/06/2017   Influenza,inj,Quad PF,6+ Mos 01/09/2017, 01/22/2019, 02/12/2022   Influenza-Unspecified 01/09/2017, 01/22/2019, 12/16/2019   Moderna Sars-Covid-2 Vaccination 10/05/2019, 11/02/2019   Tdap 04/30/2022    No results found.  Past Medical History:  Diagnosis Date   Allergies    Anxiety    Chicken pox    History of endometrial ablation 07/16/2019   No Known Allergies Past Surgical History:  Procedure Laterality Date   APPENDECTOMY  2018   CESAREAN SECTION  2010   x2-2010,2012   Family History  Problem Relation Age of Onset   Hyperlipidemia Mother    Diabetes Father    Hypertension Father    Hyperlipidemia Father    Learning disabilities Son    Diabetes Maternal Grandmother    Diabetes Maternal Grandfather    Hypertension Maternal Grandfather    Hyperlipidemia Maternal Grandfather  Heart attack Maternal Grandfather    Early death Paternal Grandmother    Heart attack Paternal Grandmother    Hypertension Paternal Grandfather    Hyperlipidemia Paternal Grandfather    Social History   Social History Narrative   Marital status/children/pets: Married, G2 P2   Education/employment: Bachelor's degree.  Employed as an Field seismologist:      -smoke alarm in the home:Yes     - wears seatbelt: Yes     - Feels safe in their relationships: Yes       Allergies as of 07/16/2022   No Known Allergies      Medication List        Accurate as of Jul 16, 2022  9:01 AM. If you have any questions, ask your nurse or doctor.          STOP taking these medications    fluticasone 50 MCG/ACT nasal spray Commonly known  as: FLONASE Stopped by: Felix Pacini, DO   lisdexamfetamine 30 MG capsule Commonly known as: VYVANSE Stopped by: Felix Pacini, DO       TAKE these medications    Cholecalciferol 1.25 MG (50000 UT) Tabs Take by mouth.   MAG GLYCINATE PO Take by mouth.   methylphenidate 36 MG CR tablet Commonly known as: CONCERTA Take 1 tablet (36 mg total) by mouth daily. Started by: Felix Pacini, DO   methylphenidate 36 MG CR tablet Commonly known as: CONCERTA Take 1 tablet (36 mg total) by mouth daily. Start taking on: August 10, 2022 Started by: Felix Pacini, DO   methylphenidate 36 MG CR tablet Commonly known as: CONCERTA Take 1 tablet (36 mg total) by mouth daily. Start taking on: September 11, 2022 Started by: Felix Pacini, DO   methylphenidate 36 MG CR tablet Commonly known as: CONCERTA Take 1 tablet (36 mg total) by mouth daily. Start taking on: October 08, 2022 Started by: Felix Pacini, DO   PROBIOTIC BLEND PO Take by mouth.        All past medical history, surgical history, allergies, family history, immunizations andmedications were updated in the EMR today and reviewed under the history and medication portions of their EMR.      ROS 14 pt review of systems performed and negative (unless mentioned in an HPI)  Objective: BP 120/80   Pulse (!) 104   Temp 98.6 F (37 C)   Wt 184 lb 9.6 oz (83.7 kg)   LMP 07/06/2022   SpO2 100%   BMI 32.10 kg/m  Physical Exam Vitals and nursing note reviewed.  Constitutional:      General: She is not in acute distress.    Appearance: Normal appearance. She is not ill-appearing, toxic-appearing or diaphoretic.  HENT:     Head: Normocephalic and atraumatic.  Eyes:     General: No scleral icterus.       Right eye: No discharge.        Left eye: No discharge.     Extraocular Movements: Extraocular movements intact.     Conjunctiva/sclera: Conjunctivae normal.     Pupils: Pupils are equal, round, and reactive to light.   Cardiovascular:     Rate and Rhythm: Regular rhythm. Tachycardia present.  Pulmonary:     Effort: Pulmonary effort is normal.     Breath sounds: Normal breath sounds.  Skin:    General: Skin is warm and dry.     Findings: No rash.  Neurological:     Mental Status: She is alert  and oriented to person, place, and time. Mental status is at baseline.     Motor: No weakness.     Gait: Gait normal.  Psychiatric:        Mood and Affect: Mood normal.        Behavior: Behavior normal.        Thought Content: Thought content normal.        Judgment: Judgment normal.     Assessment/plan: Khylee Guglielmo is a 47 y.o. female present for Chronic Conditions/illness Management attention deficit hyperactivity disorder (ADHD), unspecified ADHD type DC vyvanse d/y stock issues with med Start concerta 36 mg qd.  NCCS database reviewed today and appropriate Follow-up 4 months  Return in about 14 weeks (around 10/22/2022) for Routine chronic condition follow-up.  No orders of the defined types were placed in this encounter.  Meds ordered this encounter  Medications   methylphenidate 36 MG PO CR tablet    Sig: Take 1 tablet (36 mg total) by mouth daily.    Dispense:  30 tablet    Refill:  0   methylphenidate 36 MG PO CR tablet    Sig: Take 1 tablet (36 mg total) by mouth daily.    Dispense:  30 tablet    Refill:  0   methylphenidate 36 MG PO CR tablet    Sig: Take 1 tablet (36 mg total) by mouth daily.    Dispense:  30 tablet    Refill:  0   methylphenidate 36 MG PO CR tablet    Sig: Take 1 tablet (36 mg total) by mouth daily.    Dispense:  30 tablet    Refill:  0   Referral Orders  No referral(s) requested today     Note is dictated utilizing voice recognition software. Although note has been proof read prior to signing, occasional typographical errors still can be missed. If any questions arise, please do not hesitate to call for verification.  Electronically signed by: Felix Pacini, DO [facility] [place]

## 2022-08-02 ENCOUNTER — Telehealth: Payer: Self-pay

## 2022-08-02 NOTE — Telephone Encounter (Signed)
Patient called regarding medication switch. During her last visit on 5/13, her medication was changed from Vyvanse 30mg  to Methylphendiate 36mg . She does not like how it is making her feel and would like to switch back.   Patient was made aware provider is out of office but we will follow up once response from provider received.  Please further advise.

## 2022-08-08 NOTE — Telephone Encounter (Signed)
Pt advised and states that she will call the pharmacy to make sure its in stock when she returns from vacation.

## 2022-08-08 NOTE — Telephone Encounter (Signed)
Please return patient's call. She was switched from Vyvanse 30 mg to this methylphenidate 36 mg because the Vyvanse 30 mg was too difficult to find in stock at her pharmacies. Before we switch anything again, I would recommend she call her pharmacy and see if the Vyvanse 30 is in stock.  Once I switch it, I cannot change them again for her without seeing her in person.  Therefore I do want her to be certain that she is able to get the Vyvanse.

## 2022-08-15 ENCOUNTER — Telehealth: Payer: Self-pay | Admitting: Family Medicine

## 2022-08-15 MED ORDER — LISDEXAMFETAMINE DIMESYLATE 30 MG PO CAPS: 30.0000 mg | ORAL_CAPSULE | Freq: Every day | ORAL | 0 refills | Status: DC

## 2022-08-15 NOTE — Telephone Encounter (Signed)
Vyvanse x4 scripts called into requested pharmacy. Any further refills/requests will require follow up visit .

## 2022-08-15 NOTE — Telephone Encounter (Signed)
Returned call and given pt info.

## 2022-08-15 NOTE — Telephone Encounter (Signed)
Patient was instructed to give the office  a call when she located a pharmacy that has Vyvanse in stock. She located the [facility] located inside the [facility] in [place].  She states that she would have a new prescription for this and will no longer be taking methylphenidate.

## 2022-08-15 NOTE — Telephone Encounter (Signed)
Pharmacy has been updated.

## 2022-10-04 DIAGNOSIS — Z01419 Encounter for gynecological examination (general) (routine) without abnormal findings: Secondary | ICD-10-CM | POA: Diagnosis not present

## 2022-10-04 DIAGNOSIS — Z1231 Encounter for screening mammogram for malignant neoplasm of breast: Secondary | ICD-10-CM | POA: Diagnosis not present

## 2022-10-04 DIAGNOSIS — Z6831 Body mass index (BMI) 31.0-31.9, adult: Secondary | ICD-10-CM | POA: Diagnosis not present

## 2022-10-08 ENCOUNTER — Other Ambulatory Visit: Payer: Self-pay | Admitting: Obstetrics and Gynecology

## 2022-10-08 DIAGNOSIS — R928 Other abnormal and inconclusive findings on diagnostic imaging of breast: Secondary | ICD-10-CM

## 2022-10-16 ENCOUNTER — Ambulatory Visit
Admission: RE | Admit: 2022-10-16 | Discharge: 2022-10-16 | Disposition: A | Discharge status: Home / Self Care | Payer: BC Managed Care – PPO | Source: Ambulatory Visit | Attending: Obstetrics and Gynecology | Admitting: Obstetrics and Gynecology

## 2022-10-16 DIAGNOSIS — R921 Mammographic calcification found on diagnostic imaging of breast: Secondary | ICD-10-CM | POA: Diagnosis not present

## 2022-10-16 DIAGNOSIS — R928 Other abnormal and inconclusive findings on diagnostic imaging of breast: Secondary | ICD-10-CM

## 2022-10-22 ENCOUNTER — Other Ambulatory Visit (HOSPITAL_BASED_OUTPATIENT_CLINIC_OR_DEPARTMENT_OTHER): Payer: Self-pay

## 2022-10-22 ENCOUNTER — Encounter: Payer: Self-pay | Admitting: Family Medicine

## 2022-10-22 ENCOUNTER — Ambulatory Visit: Payer: BC Managed Care – PPO | Admitting: Family Medicine

## 2022-10-22 VITALS — BP 114/78 | HR 73 | Temp 98.3°F | Ht 63.58 in | Wt 184.8 lb

## 2022-10-22 DIAGNOSIS — F902 Attention-deficit hyperactivity disorder, combined type: Secondary | ICD-10-CM | POA: Diagnosis not present

## 2022-10-22 MED ORDER — LISDEXAMFETAMINE DIMESYLATE 30 MG PO CAPS: 30.0000 mg | ORAL_CAPSULE | Freq: Every day | ORAL | 0 refills | Status: DC

## 2022-10-22 MED ORDER — LISDEXAMFETAMINE DIMESYLATE 30 MG PO CAPS
30.0000 mg | ORAL_CAPSULE | Freq: Every day | ORAL | 0 refills | Status: DC
  Filled 2022-11-08: qty 30, 30d supply, fill #0

## 2022-10-22 NOTE — Progress Notes (Signed)
Patient ID: Jamie Lin, female  DOB: [birth year], 47 y.o.   MRN: [id number] Patient Care Team    Relationship Specialty Notifications Start End  Natalia Leatherwood, DO PCP - General Family Medicine  02/12/22   [facility]  Optometry  02/12/22   Harold Hedge, MD Consulting Physician Obstetrics and Gynecology  02/12/22     Chief Complaint  Patient presents with   Medical Management of Chronic Issues    No medication concerns, needs RF for Vyvanse 30mg      Subjective:  Jamie Lin is a 47 y.o.  female present for follow-up on ADHD All past medical history, surgical history, allergies, family history, immunizations, medications and social history were updated in the electronic medical record today. All recent labs, ED visits and hospitalizations within the last year were reviewed.  Adhd: Pt reports compliance with vyvanse 30 mg qd.  She feels this is working well for her. However, she has had difficulties with pharmacy stock of this medication  Prior note: She reports this was started for her in September.  She was being evaluated for her anxiety and they wanted to rule out ADHD as a potential cause of increased anxiety.  She did see a psychiatrist who diagnosed her with ADHD with mild anxiety.  They started her on Vyvanse and tapered her to 30 mg daily.  She states she does take this Monday through Friday or at least on workdays, and she feels it is helpful.  Patient reports she had 1 anxiety and panic during COVID.  Reports in the past she was prescribed Zoloft for about 6 to 8 months and she noticed it blunted her emotions.  She also gained weight on the Zoloft which she is still having trouble getting off.  She was prescribed Wellbutrin but she admits she never took the Wellbutrin.     10/22/2022    8:01 AM 07/16/2022    8:49 AM 04/30/2022    8:33 AM 02/12/2022    1:03 PM  Depression screen PHQ 2/9  Decreased Interest 0 0 0 0  Down, Depressed, Hopeless 0 0 0 0  PHQ - 2  Score 0 0 0 0  Altered sleeping 2     Tired, decreased energy 0     Change in appetite 0     Feeling bad or failure about yourself  0     Trouble concentrating 0     Moving slowly or fidgety/restless 0     Suicidal thoughts 0     PHQ-9 Score 2     Difficult doing work/chores Not difficult at all         10/22/2022    8:01 AM  GAD 7 : Generalized Anxiety Score  Nervous, Anxious, on Edge 0  Control/stop worrying 0  Worry too much - different things 0  Trouble relaxing 0  Restless 0  Easily annoyed or irritable 0  Afraid - awful might happen 0  Total GAD 7 Score 0  Anxiety Difficulty Not difficult at all          07/16/2022    8:48 AM  Fall Risk   Falls in the past year? 0  Number falls in past yr: 0  Injury with Fall? 0  Risk for fall due to : No Fall Risks  Follow up Falls evaluation completed   Immunization History  Administered Date(s) Administered   Hepatitis A, Adult 08/06/2017   Influenza,inj,Quad PF,6+ Mos 01/09/2017, 01/22/2019, 02/12/2022   Influenza-Unspecified 01/09/2017,  01/22/2019, 12/16/2019   Moderna Sars-Covid-2 Vaccination 10/05/2019, 11/02/2019   Tdap 04/30/2022    No results found.  Past Medical History:  Diagnosis Date   Allergies    Anxiety    Chicken pox    History of endometrial ablation 07/16/2019   No Known Allergies Past Surgical History:  Procedure Laterality Date   APPENDECTOMY  2018   CESAREAN SECTION  2010   x2-2010,2012   Family History  Problem Relation Age of Onset   Hyperlipidemia Mother    Diabetes Father    Hypertension Father    Hyperlipidemia Father    Learning disabilities Son    Diabetes Maternal Grandmother    Diabetes Maternal Grandfather    Hypertension Maternal Grandfather    Hyperlipidemia Maternal Grandfather    Heart attack Maternal Grandfather    Early death Paternal Grandmother    Heart attack Paternal Grandmother    Hypertension Paternal Grandfather    Hyperlipidemia Paternal Grandfather     Social History   Social History Narrative   Marital status/children/pets: Married, G2 P2   Education/employment: Bachelor's degree.  Employed as an Field seismologist:      -smoke alarm in the home:Yes     - wears seatbelt: Yes     - Feels safe in their relationships: Yes       Allergies as of 10/22/2022   No Known Allergies      Medication List        Accurate as of October 22, 2022  8:12 AM. If you have any questions, ask your nurse or doctor.          Cholecalciferol 1.25 MG (50000 UT) Tabs Take by mouth.   lisdexamfetamine 30 MG capsule Commonly known as: VYVANSE Take 1 capsule (30 mg total) by mouth daily. Start taking on: November 08, 2022 What changed: These instructions start on November 08, 2022. If you are unsure what to do until then, ask your doctor or other care provider. Changed by: Felix Pacini   lisdexamfetamine 30 MG capsule Commonly known as: VYVANSE Take 1 capsule (30 mg total) by mouth daily. Start taking on: November 18, 2022 What changed: These instructions start on November 18, 2022. If you are unsure what to do until then, ask your doctor or other care provider. Changed by: Felix Pacini   lisdexamfetamine 30 MG capsule Commonly known as: VYVANSE Take 1 capsule (30 mg total) by mouth daily. Start taking on: December 12, 2022 What changed: These instructions start on December 12, 2022. If you are unsure what to do until then, ask your doctor or other care provider. Changed by: Felix Pacini   lisdexamfetamine 30 MG capsule Commonly known as: VYVANSE Take 1 capsule (30 mg total) by mouth daily. Start taking on: January 07, 2023 What changed: These instructions start on January 07, 2023. If you are unsure what to do until then, ask your doctor or other care provider. Changed by: Felix Pacini   MAG GLYCINATE PO Take by mouth.   PROBIOTIC BLEND PO Take by mouth.        All past medical history, surgical history,  allergies, family history, immunizations andmedications were updated in the EMR today and reviewed under the history and medication portions of their EMR.      ROS 14 pt review of systems performed and negative (unless mentioned in an HPI)  Objective: BP 114/78   Pulse 73   Temp 98.3 F (36.8 C) (Oral)   Ht 5' 3.58" (  1.615 m)   Wt 184 lb 12.8 oz (83.8 kg)   SpO2 99%   BMI 32.14 kg/m  Physical Exam Vitals and nursing note reviewed.  Constitutional:      General: She is not in acute distress.    Appearance: Normal appearance. She is normal weight. She is not ill-appearing or toxic-appearing.  HENT:     Head: Normocephalic and atraumatic.  Eyes:     General: No scleral icterus.       Right eye: No discharge.        Left eye: No discharge.     Extraocular Movements: Extraocular movements intact.     Conjunctiva/sclera: Conjunctivae normal.     Pupils: Pupils are equal, round, and reactive to light.  Skin:    Findings: No rash.  Neurological:     Mental Status: She is alert and oriented to person, place, and time. Mental status is at baseline.     Motor: No weakness.     Coordination: Coordination normal.     Gait: Gait normal.  Psychiatric:        Mood and Affect: Mood normal.        Behavior: Behavior normal.        Thought Content: Thought content normal.        Judgment: Judgment normal.     Assessment/plan: Diksha Feo is a 47 y.o. female present for Chronic Conditions/illness Management attention deficit hyperactivity disorder (ADHD), unspecified ADHD type stable Continue Vyvanse 30 NCCS database reviewed today and appropriate; called in dwg Follow-up 4 months  Return in about 6 months (around 05/02/2023) for cpe (20 min), Routine chronic condition follow-up.  No orders of the defined types were placed in this encounter.  Meds ordered this encounter  Medications   lisdexamfetamine (VYVANSE) 30 MG capsule    Sig: Take 1 capsule (30 mg total) by mouth daily.     Dispense:  30 capsule    Refill:  0   lisdexamfetamine (VYVANSE) 30 MG capsule    Sig: Take 1 capsule (30 mg total) by mouth daily.    Dispense:  30 capsule    Refill:  0    May fill ~25 days after prescribed date   lisdexamfetamine (VYVANSE) 30 MG capsule    Sig: Take 1 capsule (30 mg total) by mouth daily.    Dispense:  30 capsule    Refill:  0   lisdexamfetamine (VYVANSE) 30 MG capsule    Sig: Take 1 capsule (30 mg total) by mouth daily.    Dispense:  30 capsule    Refill:  0   Referral Orders  No referral(s) requested today     Note is dictated utilizing voice recognition software. Although note has been proof read prior to signing, occasional typographical errors still can be missed. If any questions arise, please do not hesitate to call for verification.  Electronically signed by: Felix Pacini, DO [facility]

## 2022-10-22 NOTE — Patient Instructions (Addendum)
Return in about 6 months (around 05/02/2023) for cpe (20 min), Routine chronic condition follow-up.        Great to see you today.  I have refilled the medication(s) we provide.   If labs were collected or images ordered, we will inform you of  results once we have received them and reviewed. We will contact you either by echart message, or telephone call.  Please give ample time to the testing facility, and our office to run,  receive and review results. Please do not call inquiring of results, even if you can see them in your chart. We will contact you as soon as we are able. If it has been over 1 week since the test was completed, and you have not yet heard from [place], then please call us.    - echart message- for normal results that have been seen by the patient already.   - telephone call: abnormal results or if patient has not viewed results in their echart.  If a referral to a specialist was entered for you, please call us in 2 weeks if you have not heard from the specialist office to schedule.

## 2022-11-08 ENCOUNTER — Other Ambulatory Visit (HOSPITAL_BASED_OUTPATIENT_CLINIC_OR_DEPARTMENT_OTHER): Payer: Self-pay

## 2022-11-08 ENCOUNTER — Encounter: Payer: Self-pay | Admitting: Family Medicine

## 2023-01-27 DIAGNOSIS — R0981 Nasal congestion: Secondary | ICD-10-CM | POA: Diagnosis not present

## 2023-01-27 DIAGNOSIS — R051 Acute cough: Secondary | ICD-10-CM | POA: Diagnosis not present

## 2023-01-27 DIAGNOSIS — J029 Acute pharyngitis, unspecified: Secondary | ICD-10-CM | POA: Diagnosis not present

## 2023-05-03 ENCOUNTER — Encounter: Payer: Self-pay | Admitting: Family Medicine

## 2023-05-03 ENCOUNTER — Ambulatory Visit (INDEPENDENT_AMBULATORY_CARE_PROVIDER_SITE_OTHER): Payer: BC Managed Care – PPO | Admitting: Family Medicine

## 2023-05-03 VITALS — BP 118/84 | HR 78 | Temp 98.3°F | Ht 64.0 in | Wt 188.6 lb

## 2023-05-03 DIAGNOSIS — E669 Obesity, unspecified: Secondary | ICD-10-CM | POA: Insufficient documentation

## 2023-05-03 DIAGNOSIS — Z8249 Family history of ischemic heart disease and other diseases of the circulatory system: Secondary | ICD-10-CM

## 2023-05-03 DIAGNOSIS — Z131 Encounter for screening for diabetes mellitus: Secondary | ICD-10-CM

## 2023-05-03 DIAGNOSIS — Z0000 Encounter for general adult medical examination without abnormal findings: Secondary | ICD-10-CM | POA: Diagnosis not present

## 2023-05-03 DIAGNOSIS — F902 Attention-deficit hyperactivity disorder, combined type: Secondary | ICD-10-CM | POA: Diagnosis not present

## 2023-05-03 DIAGNOSIS — Z1211 Encounter for screening for malignant neoplasm of colon: Secondary | ICD-10-CM

## 2023-05-03 LAB — CBC
HCT: 41.5 % (ref 36.0–46.0)
Hemoglobin: 14 g/dL (ref 12.0–15.0)
MCHC: 33.6 g/dL (ref 30.0–36.0)
MCV: 95.3 fl (ref 78.0–100.0)
Platelets: 249 10*3/uL (ref 150.0–400.0)
RBC: 4.36 Mil/uL (ref 3.87–5.11)
RDW: 12.3 % (ref 11.5–15.5)
WBC: 4.2 10*3/uL (ref 4.0–10.5)

## 2023-05-03 LAB — COMPREHENSIVE METABOLIC PANEL
ALT: 19 U/L (ref 0–35)
AST: 14 U/L (ref 0–37)
Albumin: 4.5 g/dL (ref 3.5–5.2)
Alkaline Phosphatase: 46 U/L (ref 39–117)
BUN: 16 mg/dL (ref 6–23)
CO2: 27 meq/L (ref 19–32)
Calcium: 9.2 mg/dL (ref 8.4–10.5)
Chloride: 103 meq/L (ref 96–112)
Creatinine, Ser: 0.87 mg/dL (ref 0.40–1.20)
GFR: 80.52 mL/min (ref >60.00)
Glucose, Bld: 103 mg/dL — ABNORMAL HIGH (ref 70–99)
Potassium: 4.3 meq/L (ref 3.5–5.1)
Sodium: 137 meq/L (ref 135–145)
Total Bilirubin: 0.6 mg/dL (ref 0.2–1.2)
Total Protein: 7 g/dL (ref 6.0–8.3)

## 2023-05-03 LAB — LIPID PANEL
Cholesterol: 209 mg/dL — ABNORMAL HIGH (ref 0–200)
HDL: 53.8 mg/dL (ref >39.00)
LDL Cholesterol: 134 mg/dL — ABNORMAL HIGH (ref 0–99)
NonHDL: 155.48
Total CHOL/HDL Ratio: 4
Triglycerides: 106 mg/dL (ref 0.0–149.0)
VLDL: 21.2 mg/dL (ref 0.0–40.0)

## 2023-05-03 LAB — HEMOGLOBIN A1C: Hgb A1c MFr Bld: 5.2 % (ref 4.6–6.5)

## 2023-05-03 LAB — TSH: TSH: 3.31 u[IU]/mL (ref 0.35–5.50)

## 2023-05-03 MED ORDER — LISDEXAMFETAMINE DIMESYLATE 30 MG PO CAPS: 30.0000 mg | ORAL_CAPSULE | Freq: Every day | ORAL | 0 refills | Status: DC

## 2023-05-03 NOTE — Patient Instructions (Addendum)
 Return in about 1 year (around 05/03/2024) for cpe (20 min). Sooner if needed for medication refills or acute illnesses.        Great to see you today.  I have refilled the medication(s) we provide.   If labs were collected or images ordered, we will inform you of  results once we have received them and reviewed. We will contact you either by echart message, or telephone call.  Please give ample time to the testing facility, and our office to run,  receive and review results. Please do not call inquiring of results, even if you can see them in your chart. We will contact you as soon as we are able. If it has been over 1 week since the test was completed, and you have not yet heard from [place], then please call us.    - echart message- for normal results that have been seen by the patient already.   - telephone call: abnormal results or if patient has not viewed results in their echart.  If a referral to a specialist was entered for you, please call us in 2 weeks if you have not heard from the specialist office to schedule.

## 2023-05-03 NOTE — Progress Notes (Signed)
 Patient ID: Jamie Lin, female  DOB: Jun 26, 1975, 48 y.o.   MRN: [id number] Patient Care Team    Relationship Specialty Notifications Start End  Natalia Leatherwood, DO PCP - General Family Medicine  02/12/22   [facility] center  Optometry  02/12/22   Harold Hedge, MD Consulting Physician Obstetrics and Gynecology  02/12/22     Chief Complaint  Patient presents with   Annual Exam    Chronic Conditions/illness Management Pt is not fasting.     Subjective: Jamie Lin is a 48 y.o.  Female  present for CPE and chronic condition management All past medical history, surgical history, allergies, family history, immunizations, medications and social history were updated in the electronic medical record today. All recent labs, ED visits and hospitalizations within the last year were reviewed.  Health maintenance:  Colon cancer screening: No family history.  Routine screening at 47-cologuard ordered Mammogram: Completed [facility] completed at oncology or gyn Cervical cancer screening: last pap: 11/2021, by gynecology Immunizations: tdap UTD 2024, Influenza UTD- declined(encouraged yearly) Infectious disease screening: HIV completed, Hep C completed DEXA: Routine screening Patient has a Dental home. Hospitalizations/ED visits: Reviewed  Adhd: Pt reports compliance with vyvanse 30 mg qd.  She feels this is working well for her.Last pickup June 2024 . She reports she stopped it for awhile, but wants to restart. Prior note: She reports this was started for her in September.  She was being evaluated for her anxiety and they wanted to rule out ADHD as a potential cause of increased anxiety.  She did see a psychiatrist who diagnosed her with ADHD with mild anxiety.  They started her on Vyvanse and tapered her to 30 mg daily.  She states she does take this Monday through Friday or at least on workdays, and she feels it is helpful.  Patient reports she had 1 anxiety and panic during COVID.   Reports in the past she was prescribed Zoloft for about 6 to 8 months and she noticed it blunted her emotions.  She also gained weight on the Zoloft which she is still having trouble getting off.  She was prescribed Wellbutrin but she admits she never took the Wellbutrin     10/22/2022    8:01 AM 07/16/2022    8:49 AM 04/30/2022    8:33 AM 02/12/2022    1:03 PM  Depression screen PHQ 2/9  Decreased Interest 0 0 0 0  Down, Depressed, Hopeless 0 0 0 0  PHQ - 2 Score 0 0 0 0  Altered sleeping 2     Tired, decreased energy 0     Change in appetite 0     Feeling bad or failure about yourself  0     Trouble concentrating 0     Moving slowly or fidgety/restless 0     Suicidal thoughts 0     PHQ-9 Score 2     Difficult doing work/chores Not difficult at all         10/22/2022    8:01 AM  GAD 7 : Generalized Anxiety Score  Nervous, Anxious, on Edge 0  Control/stop worrying 0  Worry too much - different things 0  Trouble relaxing 0  Restless 0  Easily annoyed or irritable 0  Afraid - awful might happen 0  Total GAD 7 Score 0  Anxiety Difficulty Not difficult at all    Immunization History  Administered Date(s) Administered   Hepatitis A, Adult 08/06/2017   Influenza,inj,Quad PF,6+ Mos 01/09/2017,  01/22/2019, 02/12/2022   Influenza-Unspecified 01/09/2017, 01/22/2019, 12/16/2019   Moderna Sars-Covid-2 Vaccination 10/05/2019, 11/02/2019   Tdap 04/30/2022     Past Medical History:  Diagnosis Date   Allergies    Anxiety    Chicken pox    History of endometrial ablation 07/16/2019   No Known Allergies Past Surgical History:  Procedure Laterality Date   APPENDECTOMY  2018   CESAREAN SECTION  2010   x2-2010,2012   Family History  Problem Relation Age of Onset   Hyperlipidemia Mother    Diabetes Father    Hypertension Father    Hyperlipidemia Father    Learning disabilities Son    Diabetes Maternal Grandmother    Diabetes Maternal Grandfather    Hypertension Maternal  Grandfather    Hyperlipidemia Maternal Grandfather    Heart attack Maternal Grandfather    Early death Paternal Grandmother    Heart attack Paternal Grandmother    Hypertension Paternal Grandfather    Hyperlipidemia Paternal Grandfather    Social History   Social History Narrative   Marital status/children/pets: Married, G2 P2   Education/employment: Bachelor's degree.  Employed as an Field seismologist:      -smoke alarm in the home:Yes     - wears seatbelt: Yes     - Feels safe in their relationships: Yes       Allergies as of 05/03/2023   No Known Allergies      Medication List        Accurate as of May 03, 2023  9:44 AM. If you have any questions, ask your nurse or doctor.          azelastine 0.1 % nasal spray Commonly known as: ASTELIN two sprays by Both Nostrils route 2 (two) times daily. Use in each nostril as directed   brompheniramine-pseudoephedrine-DM 30-2-10 MG/5ML syrup Take 5 mLs by mouth 4 (four) times daily as needed.   Cholecalciferol 1.25 MG (50000 UT) Tabs Take by mouth.   lisdexamfetamine 30 MG capsule Commonly known as: VYVANSE Take 1 capsule (30 mg total) by mouth daily. What changed: Another medication with the same name was changed. Make sure you understand how and when to take each. Changed by: Jamie Lin   lisdexamfetamine 30 MG capsule Commonly known as: VYVANSE Take 1 capsule (30 mg total) by mouth daily. Start taking on: May 24, 2023 What changed: These instructions start on May 24, 2023. If you are unsure what to do until then, ask your doctor or other care provider. Changed by: Jamie Lin   lisdexamfetamine 30 MG capsule Commonly known as: VYVANSE Take 1 capsule (30 mg total) by mouth daily. Start taking on: June 21, 2023 What changed: These instructions start on June 21, 2023. If you are unsure what to do until then, ask your doctor or other care provider. Changed by: Jamie Lin    lisdexamfetamine 30 MG capsule Commonly known as: VYVANSE Take 1 capsule (30 mg total) by mouth daily. Start taking on: Jul 19, 2023 What changed: These instructions start on Jul 19, 2023. If you are unsure what to do until then, ask your doctor or other care provider. Changed by: Jamie Lin   MAG GLYCINATE PO Take by mouth.   PROBIOTIC BLEND PO Take by mouth.        All past medical history, surgical history, allergies, family history, immunizations andmedications were updated in the EMR today and reviewed under the history and medication portions of their EMR.  No results found for this or any previous visit (from the past 2160 hours).    ROS 14 pt review of systems performed and negative (unless mentioned in an HPI)  Objective: BP 118/84   Pulse 78   Temp 98.3 F (36.8 C)   Ht 5\' 4"  (1.626 m)   Wt 188 lb 9.6 oz (85.5 kg)   LMP 04/22/2023   SpO2 99%   BMI 32.37 kg/m  Physical Exam Vitals and nursing note reviewed.  Constitutional:      General: She is not in acute distress.    Appearance: Normal appearance. She is not ill-appearing or toxic-appearing.  HENT:     Head: Normocephalic and atraumatic.     Right Ear: Tympanic membrane, ear canal and external ear normal. There is no impacted cerumen.     Left Ear: Tympanic membrane, ear canal and external ear normal. There is no impacted cerumen.     Nose: No congestion or rhinorrhea.     Mouth/Throat:     Mouth: Mucous membranes are moist.     Pharynx: Oropharynx is clear. No oropharyngeal exudate or posterior oropharyngeal erythema.  Eyes:     General: No scleral icterus.       Right eye: No discharge.        Left eye: No discharge.     Extraocular Movements: Extraocular movements intact.     Conjunctiva/sclera: Conjunctivae normal.     Pupils: Pupils are equal, round, and reactive to light.  Cardiovascular:     Rate and Rhythm: Normal rate and regular rhythm.     Pulses: Normal pulses.     Heart  sounds: Normal heart sounds. No murmur heard.    No friction rub. No gallop.  Pulmonary:     Effort: Pulmonary effort is normal. No respiratory distress.     Breath sounds: Normal breath sounds. No stridor. No wheezing, rhonchi or rales.  Chest:     Chest wall: No tenderness.  Abdominal:     General: Abdomen is flat. Bowel sounds are normal. There is no distension.     Palpations: Abdomen is soft. There is no mass.     Tenderness: There is no abdominal tenderness. There is no right CVA tenderness, left CVA tenderness, guarding or rebound.     Hernia: No hernia is present.  Musculoskeletal:        General: No swelling, tenderness or deformity. Normal range of motion.     Cervical back: Normal range of motion and neck supple. No rigidity or tenderness.     Right lower leg: No edema.     Left lower leg: No edema.  Lymphadenopathy:     Cervical: No cervical adenopathy.  Skin:    General: Skin is warm and dry.     Coloration: Skin is not jaundiced or pale.     Findings: No bruising, erythema, lesion or rash.  Neurological:     General: No focal deficit present.     Mental Status: She is alert and oriented to person, place, and time. Mental status is at baseline.     Cranial Nerves: No cranial nerve deficit.     Sensory: No sensory deficit.     Motor: No weakness.     Coordination: Coordination normal.     Gait: Gait normal.     Deep Tendon Reflexes: Reflexes normal.  Psychiatric:        Mood and Affect: Mood normal.        Behavior: Behavior normal.  Thought Content: Thought content normal.        Judgment: Judgment normal.      No results found.  Assessment/plan: Jamie Lin is a 48 y.o. female present for CPE  Routine general medical examination at a health care facility Patient was encouraged to exercise greater than 150 minutes a week. Patient was encouraged to choose a diet filled with fresh fruits and vegetables, and lean meats. AVS provided to patient today for  education/recommendation on gender specific health and safety maintenance. Colon cancer screening: No family history.  Routine screening at 47-cologuard ordered Mammogram: Completed [facility]-[place] completed at oncology or gyn Cervical cancer screening: last pap: 11/2021, by gynecology Immunizations: tdap UTD 2024, Influenza UTD- declined(encouraged yearly) Infectious disease screening: HIV completed, Hep C completed DEXA: Routine screening - CBC - Comprehensive metabolic panel - TSH  Colon cancer screening - Cologuard Attention deficit hyperactivity disorder (ADHD), combined type Stable Vyvanse 30 mg every day x4 F/u 4 mos if needing refills  FH: heart disease/E66.9 (BMI 30-39.9)/Diabetes mellitus screening - Hemoglobin A1c - Lipid panel  Return in about 1 year (around 05/03/2024) for cpe (20 min).   Orders Placed This Encounter  Procedures   CBC   Comprehensive metabolic panel   Hemoglobin A1c   Lipid panel   TSH   Cologuard   Meds ordered this encounter  Medications   lisdexamfetamine (VYVANSE) 30 MG capsule    Sig: Take 1 capsule (30 mg total) by mouth daily.    Dispense:  30 capsule    Refill:  0   lisdexamfetamine (VYVANSE) 30 MG capsule    Sig: Take 1 capsule (30 mg total) by mouth daily.    Dispense:  30 capsule    Refill:  0   lisdexamfetamine (VYVANSE) 30 MG capsule    Sig: Take 1 capsule (30 mg total) by mouth daily.    Dispense:  30 capsule    Refill:  0   lisdexamfetamine (VYVANSE) 30 MG capsule    Sig: Take 1 capsule (30 mg total) by mouth daily.    Dispense:  30 capsule    Refill:  0   Referral Orders  No referral(s) requested today     Electronically signed by: Jamie Pacini, DO [facility] [place]

## 2023-05-06 ENCOUNTER — Encounter: Payer: Self-pay | Admitting: Family Medicine

## 2023-05-31 DIAGNOSIS — Z1211 Encounter for screening for malignant neoplasm of colon: Secondary | ICD-10-CM | POA: Diagnosis not present

## 2023-06-04 LAB — COLOGUARD: COLOGUARD: NEGATIVE

## 2023-11-14 ENCOUNTER — Ambulatory Visit (INDEPENDENT_AMBULATORY_CARE_PROVIDER_SITE_OTHER): Admitting: Family Medicine

## 2023-11-14 ENCOUNTER — Encounter: Payer: Self-pay | Admitting: Family Medicine

## 2023-11-14 VITALS — BP 116/76 | HR 75 | Temp 98.5°F | Wt 192.6 lb

## 2023-11-14 DIAGNOSIS — F902 Attention-deficit hyperactivity disorder, combined type: Secondary | ICD-10-CM | POA: Diagnosis not present

## 2023-11-14 DIAGNOSIS — Z23 Encounter for immunization: Secondary | ICD-10-CM | POA: Diagnosis not present

## 2023-11-14 DIAGNOSIS — F419 Anxiety disorder, unspecified: Secondary | ICD-10-CM | POA: Insufficient documentation

## 2023-11-14 DIAGNOSIS — F3281 Premenstrual dysphoric disorder: Secondary | ICD-10-CM | POA: Insufficient documentation

## 2023-11-14 MED ORDER — LISDEXAMFETAMINE DIMESYLATE 30 MG PO CAPS: 30.0000 mg | ORAL_CAPSULE | Freq: Every day | ORAL | 0 refills | Status: DC

## 2023-11-14 MED ORDER — FLUOXETINE HCL 20 MG PO CAPS: ORAL_CAPSULE | ORAL | 3 refills | Status: DC

## 2023-11-14 NOTE — Progress Notes (Signed)
 Patient ID: Jamie Lin, female  DOB: 06/16/1975, 48 y.o.   MRN: [id number] Patient Care Team    Relationship Specialty Notifications Start End  Catherine Charlies LABOR, DO PCP - General Family Medicine  02/12/22   [facility]  Optometry  02/12/22   Curlene Agent, MD Consulting Physician Obstetrics and Gynecology  02/12/22     Chief Complaint  Patient presents with   ADHD    Pt has not been taking Vyvanse . Requesting refills.     Subjective: Jamie Lin is a 48 y.o.  Female  present for  chronic condition management All past medical history, surgical history, allergies, family history, immunizations, medications and social history were updated in the electronic medical record today. All recent labs, ED visits and hospitalizations within the last year were reviewed.   Adhd/anxiety/PMDD: Pt reports when she takes the Vyvanse  it works very well for her.  She had taken a break off Vyvanse .  She feels this is working well for her .  Patient feels it is helping her focus She reports she has noticed she has become a lot more irritable surrounding the start of her menstrual cycle.  She discussed this with her gynecology team, and they recommended she discuss with her PCP.  Patient would like to refrain from taking the medication daily to help with anxiety over PMDD.  She had a bad experience with Zoloft causing her to be emotionally blunted and gain weight. Prior note: She reports this was started for her in September.  She was being evaluated for her anxiety and they wanted to rule out ADHD as a potential cause of increased anxiety.  She did see a psychiatrist who diagnosed her with ADHD with mild anxiety.  They started her on Vyvanse  and tapered her to 30 mg daily.  She states she does take this Monday through Friday or at least on workdays, and she feels it is helpful.  Patient reports she had 1 anxiety and panic during COVID.  Reports in the past she was prescribed Zoloft for about 6 to  8 months and she noticed it blunted her emotions.  She also gained weight on the Zoloft which she is still having trouble getting off.  She was prescribed Wellbutrin but she admits she never took the Wellbutrin     10/22/2022    8:01 AM 07/16/2022    8:49 AM 04/30/2022    8:33 AM 02/12/2022    1:03 PM  Depression screen PHQ 2/9  Decreased Interest 0 0 0 0  Down, Depressed, Hopeless 0 0 0 0  PHQ - 2 Score 0 0 0 0  Altered sleeping 2     Tired, decreased energy 0     Change in appetite 0     Feeling bad or failure about yourself  0     Trouble concentrating 0     Moving slowly or fidgety/restless 0     Suicidal thoughts 0     PHQ-9 Score 2     Difficult doing work/chores Not difficult at all         10/22/2022    8:01 AM  GAD 7 : Generalized Anxiety Score  Nervous, Anxious, on Edge 0  Control/stop worrying 0  Worry too much - different things 0  Trouble relaxing 0  Restless 0  Easily annoyed or irritable 0  Afraid - awful might happen 0  Total GAD 7 Score 0  Anxiety Difficulty Not difficult at all    Immunization  History  Administered Date(s) Administered   Hepatitis A, Adult 08/06/2017   Influenza,inj,Quad PF,6+ Mos 01/09/2017, 01/22/2019, 02/12/2022   Influenza-Unspecified 01/09/2017, 01/22/2019, 12/16/2019   Moderna Sars-Covid-2 Vaccination 10/05/2019, 11/02/2019   Tdap 04/30/2022     Past Medical History:  Diagnosis Date   Allergies    Anxiety    Chicken pox    History of endometrial ablation 07/16/2019   No Known Allergies Past Surgical History:  Procedure Laterality Date   APPENDECTOMY  2018   CESAREAN SECTION  2010   x2-2010,2012   Family History  Problem Relation Age of Onset   Hyperlipidemia Mother    Diabetes Father    Hypertension Father    Hyperlipidemia Father    Learning disabilities Son    Diabetes Maternal Grandmother    Diabetes Maternal Grandfather    Hypertension Maternal Grandfather    Hyperlipidemia Maternal Grandfather    Heart  attack Maternal Grandfather    Early death Paternal Grandmother    Heart attack Paternal Grandmother    Hypertension Paternal Grandfather    Hyperlipidemia Paternal Grandfather    Social History   Social History Narrative   Marital status/children/pets: Married, G2 P2   Education/employment: Bachelor's degree.  Employed as an Field seismologist:      -smoke alarm in the home:Yes     - wears seatbelt: Yes     - Feels safe in their relationships: Yes       Allergies as of 11/14/2023   No Known Allergies      Medication List        Accurate as of November 14, 2023 10:40 AM. If you have any questions, ask your nurse or doctor.          azelastine 0.1 % nasal spray Commonly known as: ASTELIN two sprays by Both Nostrils route 2 (two) times daily. Use in each nostril as directed   brompheniramine-pseudoephedrine-DM 30-2-10 MG/5ML syrup Take 5 mLs by mouth 4 (four) times daily as needed.   Cholecalciferol 1.25 MG (50000 UT) Tabs Take by mouth.   FLUoxetine  20 MG capsule Commonly known as: PROZAC  1 tab PO week prior to menstrual cycle Started by: Charlies Bellini   lisdexamfetamine 30 MG capsule Commonly known as: VYVANSE  Take 1 capsule (30 mg total) by mouth daily. What changed: Another medication with the same name was changed. Make sure you understand how and when to take each. Changed by: Charlies Bellini   lisdexamfetamine 30 MG capsule Commonly known as: VYVANSE  Take 1 capsule (30 mg total) by mouth daily. Start taking on: December 06, 2023 What changed: These instructions start on December 06, 2023. If you are unsure what to do until then, ask your doctor or other care provider. Changed by: Charlies Bellini   lisdexamfetamine 30 MG capsule Commonly known as: VYVANSE  Take 1 capsule (30 mg total) by mouth daily. Start taking on: January 02, 2024 What changed: These instructions start on January 02, 2024. If you are unsure what to do until then, ask your  doctor or other care provider. Changed by: Charlies Bellini   lisdexamfetamine 30 MG capsule Commonly known as: VYVANSE  Take 1 capsule (30 mg total) by mouth daily. Start taking on: January 21, 2024 What changed: These instructions start on January 21, 2024. If you are unsure what to do until then, ask your doctor or other care provider. Changed by: Charlies Bellini   MAG GLYCINATE PO Take by mouth.   PROBIOTIC BLEND PO Take by mouth.  All past medical history, surgical history, allergies, family history, immunizations andmedications were updated in the EMR today and reviewed under the history and medication portions of their EMR.     No results found for this or any previous visit (from the past 2160 hours).    ROS 14 pt review of systems performed and negative (unless mentioned in an HPI)  Objective: BP 116/76   Pulse 75   Temp 98.5 F (36.9 C)   Wt 192 lb 9.6 oz (87.4 kg)   SpO2 99%   BMI 33.06 kg/m  Physical Exam Vitals and nursing note reviewed.  Constitutional:      General: She is not in acute distress.    Appearance: Normal appearance. She is normal weight. She is not ill-appearing or toxic-appearing.  HENT:     Head: Normocephalic and atraumatic.  Eyes:     General: No scleral icterus.       Right eye: No discharge.        Left eye: No discharge.     Extraocular Movements: Extraocular movements intact.     Conjunctiva/sclera: Conjunctivae normal.     Pupils: Pupils are equal, round, and reactive to light.  Cardiovascular:     Rate and Rhythm: Normal rate.  Skin:    Findings: No rash.  Neurological:     Mental Status: She is alert and oriented to person, place, and time. Mental status is at baseline.     Motor: No weakness.     Coordination: Coordination normal.     Gait: Gait normal.  Psychiatric:        Mood and Affect: Mood normal.        Behavior: Behavior normal.        Thought Content: Thought content normal.        Judgment: Judgment  normal.      No results found.  Assessment/plan: Jamie Lin is a 48 y.o. female present for chronic condition management Attention deficit hyperactivity disorder (ADHD), combined type Stable Continue Vyvanse  30 mg every day x4 [place]  controlled substance database reviewed today F/u 4 mos if needing refills  Anxiety/PMDD We discussed options and treatment plans with her today.  For now she would like to refrain from starting a daily medication.  We briefly discussed Effexor or Pristiq in the future if she changes her mind. We discussed fluoxetine  daily the week prior to her menstrual cycle and she would like to try this. Fluoxetine  20 mg p.o. daily weight prior to menstrual cycle.  Influenza vaccine needed declined  Return in about 4 months (around 03/09/2024).   No orders of the defined types were placed in this encounter.  Meds ordered this encounter  Medications   lisdexamfetamine (VYVANSE ) 30 MG capsule    Sig: Take 1 capsule (30 mg total) by mouth daily.    Dispense:  30 capsule    Refill:  0   lisdexamfetamine (VYVANSE ) 30 MG capsule    Sig: Take 1 capsule (30 mg total) by mouth daily.    Dispense:  30 capsule    Refill:  0   lisdexamfetamine (VYVANSE ) 30 MG capsule    Sig: Take 1 capsule (30 mg total) by mouth daily.    Dispense:  30 capsule    Refill:  0   lisdexamfetamine (VYVANSE ) 30 MG capsule    Sig: Take 1 capsule (30 mg total) by mouth daily.    Dispense:  30 capsule    Refill:  0   FLUoxetine  (PROZAC )  20 MG capsule    Sig: 1 tab PO week prior to menstrual cycle    Dispense:  30 capsule    Refill:  3   Referral Orders  No referral(s) requested today     Electronically signed by: Charlies Bellini, DO [facility] [place]

## 2023-11-14 NOTE — Patient Instructions (Addendum)
 Return in about 4 months (around 03/09/2024).        Great to see you today.  I have refilled the medication(s) we provide.   If labs were collected or images ordered, we will inform you of  results once we have received them and reviewed. We will contact you either by echart message, or telephone call.  Please give ample time to the testing facility, and our office to run,  receive and review results. Please do not call inquiring of results, even if you can see them in your chart. We will contact you as soon as we are able. If it has been over 1 week since the test was completed, and you have not yet heard from us , then please call us .    - echart message- for normal results that have been seen by the patient already.   - telephone call: abnormal results or if patient has not viewed results in their echart.  If a referral to a specialist was entered for you, please call us  in 2 weeks if you have not heard from the specialist office to schedule.

## 2024-01-16 ENCOUNTER — Other Ambulatory Visit: Payer: Self-pay | Admitting: Family Medicine

## 2024-02-18 ENCOUNTER — Ambulatory Visit: Payer: Self-pay

## 2024-02-18 NOTE — Telephone Encounter (Signed)
 FYI Only or Action Required?: FYI only for provider: Home Care Advised.  Patient was last seen in primary care on 11/14/2023 by Catherine Fuller A, DO.  Called Nurse Triage reporting Foreign Body in Skin.  Symptoms began several days ago.  Interventions attempted: OTC medications: antibiotic ointment, Rest, hydration, or home remedies, and Other: attempted removal.  Symptoms are: unchanged.  Triage Disposition: Home Care  Patient/caregiver understands and will follow disposition?: Yes   Copied from CRM #[id number]. Topic: Clinical - Medical Advice >> Feb 18, 2024 10:07 AM Jamie Lin wrote: Reason for CRM: Patient has a splinter in her heel but no pain or redness. She wants to know if she should come in to get it removed, or see if it's something that doesn't need much attention.  Please call: [phone] Reason for Disposition  Minor sliver, splinter, or thorn that needs removal  Answer Assessment - Initial Assessment Questions Additional info: Unknown splinter in her heel for several days, no signs of infection. Plan to treat and monitor at home, she will call back if she develops signs of infection.    1. MECHANISM: How did it happen?      Walking barefoot  2. LOCATION: Where is the FB (e.g., splinter) located?      Heel  3. OBJECT: What type of FB was it?      Splinter unsure what object 4. DEPTH: How deep do you think the FB goes?      Deep in heel  5. ONSET: When did the injury occur? (Minutes or hours)      Unsure-few days 6. PAIN: Is it painful? If Yes, ask: How bad is the pain?  (Scale 1-10; or mild, moderate, severe)     denies 7. TETANUS: When was your last tetanus booster?     2024 8. PREGNANCY: Is there any chance you are pregnant? When was your last menstrual period?  Protocols used: Skin Foreign Body-A-AH

## 2024-03-09 ENCOUNTER — Ambulatory Visit: Admitting: Family Medicine

## 2024-03-13 ENCOUNTER — Ambulatory Visit: Admitting: Family Medicine

## 2024-03-26 ENCOUNTER — Telehealth: Payer: Self-pay

## 2024-03-26 NOTE — Telephone Encounter (Signed)
 Communication  Reason for CRM: Pt husband has Flu B again and pt is wanting to know if we can  prescribe Tamiflu as a preventative.   MyChart sent.

## 2024-03-30 ENCOUNTER — Ambulatory Visit: Admitting: Family Medicine

## 2024-04-01 ENCOUNTER — Encounter: Payer: Self-pay | Admitting: Family Medicine

## 2024-04-01 ENCOUNTER — Ambulatory Visit: Admitting: Family Medicine

## 2024-04-01 VITALS — BP 110/72 | HR 102 | Temp 98.1°F | Wt 188.0 lb

## 2024-04-01 DIAGNOSIS — F3281 Premenstrual dysphoric disorder: Secondary | ICD-10-CM | POA: Diagnosis not present

## 2024-04-01 DIAGNOSIS — F902 Attention-deficit hyperactivity disorder, combined type: Secondary | ICD-10-CM

## 2024-04-01 DIAGNOSIS — F419 Anxiety disorder, unspecified: Secondary | ICD-10-CM

## 2024-04-01 MED ORDER — FLUOXETINE HCL 20 MG PO CAPS: ORAL_CAPSULE | ORAL | 1 refills | Status: AC

## 2024-04-01 MED ORDER — LISDEXAMFETAMINE DIMESYLATE 30 MG PO CAPS: 30.0000 mg | ORAL_CAPSULE | Freq: Every day | ORAL | 0 refills | Status: AC

## 2024-04-01 NOTE — Patient Instructions (Addendum)

## 2024-04-01 NOTE — Progress Notes (Signed)
 "   Patient ID: Jamie Lin, female  DOB: December 05, 1975, 49 y.o.   MRN: [id number] Patient Care Team    Relationship Specialty Notifications Start End  Catherine Jamie LABOR, DO PCP - General Family Medicine  02/12/22   [facility]  Optometry  02/12/22   Curlene Agent, MD Consulting Physician Obstetrics and Gynecology  02/12/22     Chief Complaint  Patient presents with   ADHD    Subjective: Jamie Lin is a 49 y.o.  Female  present for  chronic condition management All past medical history, surgical history, allergies, family history, immunizations,  and social history were updated in the electronic medical record today. All recent labs, ED visits and hospitalizations within the last year were reviewed. Medication reconciliation completed today  Adhd Pt reports when she takes the Vyvanse  30 mg daily it works very well for her.   She feels it does help her focus and improves her quality life. Prior note: Prior note: She reports this was started for her in September.  She was being evaluated for her anxiety and they wanted to rule out ADHD as a potential cause of increased anxiety.  She did see a psychiatrist who diagnosed her with ADHD with mild anxiety.  They started her on Vyvanse  and tapered her to 30 mg daily.  She states she does take this Monday through Friday or at least on workdays, and she feels it is helpful.  Patient reports she had 1 anxiety and panic during COVID.  Reports in the past she was prescribed Zoloft for about 6 to 8 months and she noticed it blunted her emotions.  She also gained weight on the Zoloft which she is still having trouble getting off.  She was prescribed Wellbutrin but she admits she never took the Wellbutrin  Anxiety: Patient did not start the fluoxetine , which she was going to take 1 tab daily the week prior to her menstrual cycle.  She reports today she has increased anxiety and lack of motivation.  She is working from home and notices she is  becoming more home dwelling, mainly going out of the house for her kids sports events.  She reports she has put back on weight, not watching her diet or being active.  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     04/01/2024    3:14 PM 10/22/2022    8:01 AM 07/16/2022    8:49 AM 04/30/2022    8:33 AM 02/12/2022    1:03 PM  Depression screen PHQ 2/9  Decreased Interest 0 0 0 0 0  Down, Depressed, Hopeless 0 0 0 0 0  PHQ - 2 Score 0 0 0 0 0  Altered sleeping  2     Tired, decreased energy  0     Change in appetite  0     Feeling bad or failure about yourself   0     Trouble concentrating  0     Moving slowly or fidgety/restless  0     Suicidal thoughts  0     PHQ-9 Score  2      Difficult doing work/chores  Not difficult at all        Data saved with a previous flowsheet row definition      10/22/2022    8:01 AM  GAD 7 : Generalized Anxiety Score  Nervous, Anxious, on Edge 0   Control/stop worrying 0   Worry too much -  different things 0   Trouble relaxing 0   Restless 0   Easily annoyed or irritable 0   Afraid - awful might happen 0   Total GAD 7 Score 0  Anxiety Difficulty Not difficult at all     Data saved with a previous flowsheet row definition      07/16/2022    8:48 AM 04/01/2024    3:15 PM  Fall Risk  Falls in the past year? 0 0  Was there an injury with Fall? 0  0  Fall Risk Category Calculator 0 0  Patient at Risk for Falls Due to No Fall Risks No Fall Risks  Fall risk Follow up Falls evaluation completed Falls evaluation completed     Data saved with a previous flowsheet row definition     Immunization History  Administered Date(s) Administered   Hepatitis A, Adult 08/06/2017   Influenza,inj,Quad PF,6+ Mos 01/09/2017, 01/22/2019, 02/12/2022   Influenza-Unspecified 01/09/2017, 01/22/2019, 12/16/2019   Moderna Sars-Covid-2 Vaccination 10/05/2019, 11/02/2019   Tdap 04/30/2022     Past Medical History:  Diagnosis  Date   Allergies    Anxiety    Chicken pox    History of endometrial ablation 07/16/2019   No Known Allergies Past Surgical History:  Procedure Laterality Date   APPENDECTOMY  2018   CESAREAN SECTION  2010   x2-2010,2012   Family History  Problem Relation Age of Onset   Hyperlipidemia Mother    Diabetes Father    Hypertension Father    Hyperlipidemia Father    Learning disabilities Son    Diabetes Maternal Grandmother    Diabetes Maternal Grandfather    Hypertension Maternal Grandfather    Hyperlipidemia Maternal Grandfather    Heart attack Maternal Grandfather    Early death Paternal Grandmother    Heart attack Paternal Grandmother    Hypertension Paternal Grandfather    Hyperlipidemia Paternal Grandfather    Social History   Social History Narrative   Marital status/children/pets: Married, G2 P2   Education/employment: Bachelor's degree.  Employed as an Field Seismologist:      -smoke alarm in the home:Yes     - wears seatbelt: Yes     - Feels safe in their relationships: Yes       Allergies as of 04/01/2024   No Known Allergies      Medication List        Accurate as of April 01, 2024  3:16 PM. If you have any questions, ask your nurse or doctor.          STOP taking these medications    azelastine 0.1 % nasal spray Commonly known as: ASTELIN Stopped by: Jamie Whirley, DO   brompheniramine-pseudoephedrine-DM 30-2-10 MG/5ML syrup Stopped by: Jamie Bellini, DO   progesterone 100 MG capsule Commonly known as: PROMETRIUM Stopped by: Jamie Bellini, DO       TAKE these medications    Cholecalciferol 1.25 MG (50000 UT) Tabs Take by mouth.   FLUoxetine  20 MG capsule Commonly known as: PROZAC  1 tab PO week prior to menstrual cycle   lisdexamfetamine  30 MG capsule Commonly known as: VYVANSE  Take 1 capsule (30 mg total) by mouth daily. What changed: Another medication with the same name was changed. Make sure you understand  how and when to take each. Changed by: Jamie Bellini, DO   lisdexamfetamine  30 MG capsule Commonly known as: VYVANSE  Take 1 capsule (30 mg total) by mouth daily. Start taking on: April 23, 2024 What  changed: These instructions start on April 23, 2024. If you are unsure what to do until then, ask your doctor or other care provider. Changed by: Jamie Bellini, DO   lisdexamfetamine  30 MG capsule Commonly known as: VYVANSE  Take 1 capsule (30 mg total) by mouth daily. Start taking on: May 16, 2024 What changed: These instructions start on May 16, 2024. If you are unsure what to do until then, ask your doctor or other care provider. Changed by: Jamie Bellini, DO   lisdexamfetamine  30 MG capsule Commonly known as: VYVANSE  Take 1 capsule (30 mg total) by mouth daily. Start taking on: June 08, 2024 What changed: These instructions start on June 08, 2024. If you are unsure what to do until then, ask your doctor or other care provider. Changed by: Jamie Bellini, DO   MAG GLYCINATE PO Take by mouth.   PROBIOTIC BLEND PO Take by mouth.        All past medical history, surgical history, allergies, family history, immunizations andmedications were updated in the EMR today and reviewed under the history and medication portions of their EMR.     No results found for this or any previous visit (from the past 2160 hours).    Review of Systems  Constitutional: Negative.   HENT: Negative.    Eyes: Negative.   Respiratory: Negative.    Cardiovascular: Negative.   Gastrointestinal: Negative.   Genitourinary: Negative.   Musculoskeletal: Negative.   Skin: Negative.   Neurological: Negative.   Endo/Heme/Allergies: Negative.   Psychiatric/Behavioral: Negative.    All other systems reviewed and are negative.  14 pt review of systems performed and negative (unless mentioned in an HPI)  Objective: BP 110/72   Pulse (!) 102   Temp 98.1 F (36.7 C)   Wt 188 lb (85.3 kg)   SpO2  99%   BMI 32.27 kg/m  Physical Exam Vitals and nursing note reviewed.  Constitutional:      General: She is not in acute distress.    Appearance: Normal appearance. She is normal weight. She is not ill-appearing or toxic-appearing.  HENT:     Head: Normocephalic and atraumatic.  Eyes:     General: No scleral icterus.       Right eye: No discharge.        Left eye: No discharge.     Extraocular Movements: Extraocular movements intact.     Conjunctiva/sclera: Conjunctivae normal.     Pupils: Pupils are equal, round, and reactive to light.  Cardiovascular:     Rate and Rhythm: Normal rate and regular rhythm.     Heart sounds: No murmur heard. Skin:    Findings: No rash.  Neurological:     Mental Status: She is alert and oriented to person, place, and time. Mental status is at baseline.     Motor: No weakness.     Coordination: Coordination normal.     Gait: Gait normal.  Psychiatric:        Mood and Affect: Mood is anxious. Affect is tearful.        Speech: Speech normal.        Behavior: Behavior normal.        Thought Content: Thought content normal.        Cognition and Memory: Cognition and memory normal.        Judgment: Judgment normal.      No results found.  Assessment/plan: Ivry Bardwell is a 49 y.o. female present for chronic condition management Attention deficit  hyperactivity disorder (ADHD), combined type Stable Continue Vyvanse  30 mg every day #30, scripts x4 [place]  controlled substance database reviewed today 04/01/2024 Controlled substance contract updated today UDS collected today 04/01/2024 F/u 4 mos if needing refills  Anxiety Increased anxiety Fluoxetine  20 mg daily discussed and patient is willing to start daily to help with her increased anxiety.   Return in about 15 weeks (around 07/15/2024).    Orders Placed This Encounter  Procedures   Drug Monitoring Panel [phone] , Urine   Meds ordered this encounter  Medications    lisdexamfetamine  (VYVANSE ) 30 MG capsule    Sig: Take 1 capsule (30 mg total) by mouth daily.    Dispense:  30 capsule    Refill:  0   lisdexamfetamine  (VYVANSE ) 30 MG capsule    Sig: Take 1 capsule (30 mg total) by mouth daily.    Dispense:  30 capsule    Refill:  0   lisdexamfetamine  (VYVANSE ) 30 MG capsule    Sig: Take 1 capsule (30 mg total) by mouth daily.    Dispense:  30 capsule    Refill:  0   lisdexamfetamine  (VYVANSE ) 30 MG capsule    Sig: Take 1 capsule (30 mg total) by mouth daily.    Dispense:  30 capsule    Refill:  0   FLUoxetine  (PROZAC ) 20 MG capsule    Sig: 1 tab PO week prior to menstrual cycle    Dispense:  90 capsule    Refill:  1   Referral Orders  No referral(s) requested today     Electronically signed by: Jamie Bellini, DO [facility] [place] "
